# Patient Record
Sex: Female | Born: 1965 | Race: White | Hispanic: No | Marital: Married | Smoking: Never smoker
Health system: Southern US, Community
[De-identification: ages and names within clinical notes are randomized; demographics above are authoritative.]

## PROBLEM LIST (undated history)

## (undated) DIAGNOSIS — I456 Pre-excitation syndrome: Secondary | ICD-10-CM

## (undated) DIAGNOSIS — G43909 Migraine, unspecified, not intractable, without status migrainosus: Secondary | ICD-10-CM

## (undated) DIAGNOSIS — K589 Irritable bowel syndrome without diarrhea: Secondary | ICD-10-CM

## (undated) DIAGNOSIS — I7300 Raynaud's syndrome without gangrene: Secondary | ICD-10-CM

## (undated) DIAGNOSIS — K219 Gastro-esophageal reflux disease without esophagitis: Secondary | ICD-10-CM

## (undated) DIAGNOSIS — T7840XA Allergy, unspecified, initial encounter: Secondary | ICD-10-CM

## (undated) DIAGNOSIS — M797 Fibromyalgia: Secondary | ICD-10-CM

## (undated) DIAGNOSIS — H04129 Dry eye syndrome of unspecified lacrimal gland: Secondary | ICD-10-CM

## (undated) DIAGNOSIS — I10 Essential (primary) hypertension: Secondary | ICD-10-CM

## (undated) DIAGNOSIS — M349 Systemic sclerosis, unspecified: Secondary | ICD-10-CM

## (undated) DIAGNOSIS — K2970 Gastritis, unspecified, without bleeding: Secondary | ICD-10-CM

## (undated) HISTORY — PX: ABDOMINAL HYSTERECTOMY: SHX81

## (undated) HISTORY — DX: Fibromyalgia: M79.7

## (undated) HISTORY — DX: Essential (primary) hypertension: I10

## (undated) HISTORY — DX: Systemic sclerosis, unspecified: M34.9

## (undated) HISTORY — PX: BACK SURGERY: SHX140

## (undated) HISTORY — DX: Allergy, unspecified, initial encounter: T78.40XA

## (undated) HISTORY — DX: Gastro-esophageal reflux disease without esophagitis: K21.9

## (undated) HISTORY — DX: Migraine, unspecified, not intractable, without status migrainosus: G43.909

## (undated) HISTORY — DX: Gastritis, unspecified, without bleeding: K29.70

## (undated) HISTORY — DX: Pre-excitation syndrome: I45.6

## (undated) HISTORY — PX: NECK SURGERY: SHX720

## (undated) HISTORY — PX: PARTIAL HYSTERECTOMY: SHX80

## (undated) HISTORY — DX: Irritable bowel syndrome, unspecified: K58.9

## (undated) HISTORY — DX: Dry eye syndrome of unspecified lacrimal gland: H04.129

## (undated) HISTORY — DX: Raynaud's syndrome without gangrene: I73.00

---

## 2001-10-16 DIAGNOSIS — G43909 Migraine, unspecified, not intractable, without status migrainosus: Secondary | ICD-10-CM | POA: Insufficient documentation

## 2001-10-16 DIAGNOSIS — J309 Allergic rhinitis, unspecified: Secondary | ICD-10-CM | POA: Insufficient documentation

## 2001-10-16 HISTORY — DX: Migraine, unspecified, not intractable, without status migrainosus: G43.909

## 2001-10-16 HISTORY — DX: Allergic rhinitis, unspecified: J30.9

## 2006-10-08 DIAGNOSIS — I7300 Raynaud's syndrome without gangrene: Secondary | ICD-10-CM

## 2006-10-08 DIAGNOSIS — I10 Essential (primary) hypertension: Secondary | ICD-10-CM | POA: Insufficient documentation

## 2006-10-08 HISTORY — DX: Essential (primary) hypertension: I10

## 2006-10-08 HISTORY — DX: Raynaud's syndrome without gangrene: I73.00

## 2009-03-11 DIAGNOSIS — Z136 Encounter for screening for cardiovascular disorders: Secondary | ICD-10-CM

## 2009-03-11 HISTORY — DX: Encounter for screening for cardiovascular disorders: Z13.6

## 2009-10-16 DIAGNOSIS — F320 Major depressive disorder, single episode, mild: Secondary | ICD-10-CM | POA: Insufficient documentation

## 2009-10-16 HISTORY — DX: Major depressive disorder, single episode, mild: F32.0

## 2012-11-29 DIAGNOSIS — M4317 Spondylolisthesis, lumbosacral region: Secondary | ICD-10-CM | POA: Insufficient documentation

## 2012-11-29 HISTORY — DX: Spondylolisthesis, lumbosacral region: M43.17

## 2013-04-28 DIAGNOSIS — G43919 Migraine, unspecified, intractable, without status migrainosus: Secondary | ICD-10-CM | POA: Insufficient documentation

## 2013-04-28 DIAGNOSIS — R519 Headache, unspecified: Secondary | ICD-10-CM

## 2013-04-28 HISTORY — DX: Migraine, unspecified, intractable, without status migrainosus: G43.919

## 2013-04-28 HISTORY — DX: Headache, unspecified: R51.9

## 2013-07-29 DIAGNOSIS — G43009 Migraine without aura, not intractable, without status migrainosus: Secondary | ICD-10-CM

## 2013-07-29 HISTORY — DX: Migraine without aura, not intractable, without status migrainosus: G43.009

## 2014-07-21 DIAGNOSIS — G43919 Migraine, unspecified, intractable, without status migrainosus: Secondary | ICD-10-CM | POA: Insufficient documentation

## 2014-07-21 HISTORY — DX: Migraine, unspecified, intractable, without status migrainosus: G43.919

## 2016-04-28 DIAGNOSIS — M545 Low back pain, unspecified: Secondary | ICD-10-CM

## 2016-04-28 DIAGNOSIS — M5416 Radiculopathy, lumbar region: Secondary | ICD-10-CM | POA: Insufficient documentation

## 2016-04-28 HISTORY — DX: Low back pain, unspecified: M54.50

## 2016-04-28 HISTORY — DX: Radiculopathy, lumbar region: M54.16

## 2017-04-17 DIAGNOSIS — K589 Irritable bowel syndrome without diarrhea: Secondary | ICD-10-CM | POA: Insufficient documentation

## 2017-04-17 DIAGNOSIS — K582 Mixed irritable bowel syndrome: Secondary | ICD-10-CM | POA: Insufficient documentation

## 2017-04-17 DIAGNOSIS — D696 Thrombocytopenia, unspecified: Secondary | ICD-10-CM

## 2017-04-17 HISTORY — DX: Thrombocytopenia, unspecified: D69.6

## 2017-06-05 DIAGNOSIS — M461 Sacroiliitis, not elsewhere classified: Secondary | ICD-10-CM

## 2017-06-05 HISTORY — DX: Sacroiliitis, not elsewhere classified: M46.1

## 2017-07-01 DIAGNOSIS — M7072 Other bursitis of hip, left hip: Secondary | ICD-10-CM | POA: Insufficient documentation

## 2017-07-01 HISTORY — DX: Other bursitis of hip, left hip: M70.72

## 2017-07-17 DIAGNOSIS — M47816 Spondylosis without myelopathy or radiculopathy, lumbar region: Secondary | ICD-10-CM

## 2017-07-17 HISTORY — DX: Spondylosis without myelopathy or radiculopathy, lumbar region: M47.816

## 2017-08-18 DIAGNOSIS — N281 Cyst of kidney, acquired: Secondary | ICD-10-CM | POA: Insufficient documentation

## 2017-08-18 HISTORY — DX: Cyst of kidney, acquired: N28.1

## 2017-10-28 DIAGNOSIS — R768 Other specified abnormal immunological findings in serum: Secondary | ICD-10-CM | POA: Insufficient documentation

## 2017-10-28 HISTORY — DX: Other specified abnormal immunological findings in serum: R76.8

## 2018-04-13 ENCOUNTER — Encounter: Payer: Self-pay | Admitting: Pediatrics

## 2018-04-13 ENCOUNTER — Ambulatory Visit (INDEPENDENT_AMBULATORY_CARE_PROVIDER_SITE_OTHER): Payer: Managed Care, Other (non HMO) | Admitting: Pediatrics

## 2018-04-13 VITALS — BP 116/74 | HR 78 | Temp 97.6°F | Resp 16 | Ht 64.0 in | Wt 135.2 lb

## 2018-04-13 DIAGNOSIS — M349 Systemic sclerosis, unspecified: Secondary | ICD-10-CM | POA: Diagnosis not present

## 2018-04-13 DIAGNOSIS — J45909 Unspecified asthma, uncomplicated: Secondary | ICD-10-CM | POA: Insufficient documentation

## 2018-04-13 DIAGNOSIS — J3089 Other allergic rhinitis: Secondary | ICD-10-CM

## 2018-04-13 DIAGNOSIS — K219 Gastro-esophageal reflux disease without esophagitis: Secondary | ICD-10-CM

## 2018-04-13 DIAGNOSIS — J4530 Mild persistent asthma, uncomplicated: Secondary | ICD-10-CM | POA: Diagnosis not present

## 2018-04-13 DIAGNOSIS — I7300 Raynaud's syndrome without gangrene: Secondary | ICD-10-CM

## 2018-04-13 DIAGNOSIS — M797 Fibromyalgia: Secondary | ICD-10-CM | POA: Diagnosis not present

## 2018-04-13 HISTORY — DX: Gastro-esophageal reflux disease without esophagitis: K21.9

## 2018-04-13 HISTORY — DX: Unspecified asthma, uncomplicated: J45.909

## 2018-04-13 MED ORDER — ALBUTEROL SULFATE HFA 108 (90 BASE) MCG/ACT IN AERS: 2.0000 | INHALATION_SPRAY | RESPIRATORY_TRACT | 1 refills | Status: DC | PRN

## 2018-04-13 MED ORDER — AZELASTINE HCL 0.1 % NA SOLN: NASAL | 5 refills | Status: DC

## 2018-04-13 MED ORDER — MONTELUKAST SODIUM 10 MG PO TABS: 10.0000 mg | ORAL_TABLET | Freq: Every day | ORAL | 5 refills | Status: DC

## 2018-04-13 NOTE — Patient Instructions (Addendum)
Environmental control of dust and mold Claritin 10 mg-take 1 tablet once a day if needed for runny nose Azelastine 0.1% - 2 sprays per nostril twice a day if needed for stuffy nose  or a sinus headache Montelukast 10 mg-take 1 tablet once a day to prevent coughing or wheezing Pro-air 2 puffs every 4 hours if needed for wheezing or coughing spells.  You may use Pro-air 2 puffs 5 to 15 minutes before exercise Add prednisone 10 mg twice a day for 4 days, 10 mg in the fifth day to bring your allergic symptoms under control Call us if you are not doing well on this treatment plan

## 2018-04-13 NOTE — Progress Notes (Signed)
[address] [place] [address] Dept: [phone]  New Patient Note  Patient ID: Jennifer Reed, female    DOB: 1966/02/11  Age: 52 y.o. MRN: [id number] Date of Office Visit: 04/13/2018 Referring provider: No referring provider defined for this encounter.    Chief Complaint: Cough (dx with scleroderma, Raynauds disorder, fibromyalgia and migraines.) and Allergic Rhinitis  (runny nose)  HPI Jennifer Reed presents for evaluation of a runny nose for about a year.  Her symptoms are perennial.  She also has had a cough.  She moved into this area from [place] about a year ago She initially lived in a house that had mold.  They moved into a new house 3 months ago.  She has aggravation of her nasal congestion and a runny nose on exposure to dust, cigarette smoke, perfumes and colognes.  She has had sinus headaches She has a lactose intolerance.  Two years ago following an intestinal infection, she became intolerant of gluten but she had negative celiac antibodies .  She has never had asthmatic symptoms in the past except for a cough during the past year.  She had pneumonia once in the past.  She has gastroesophageal reflux but does does not take medications  She has had Raynaud's syndrome for several years.  Last year she was diagnosed with scleroderma.  She has had fibromyalgia for several years.  She has had thrombocytopenia that comes and goes.  Following lumbar surgery a few years ago she developed a hematoma of her abdomen probably related to the thrombocytopenia and she received for blood transfusions.  Review of Systems  Constitutional: Negative.   HENT:       Runny nose and sinus headaches for about a year  Eyes:       She wears contact lenses  Respiratory:       Cough for about a year .  Pneumonia once in the past  Cardiovascular:       Raynaud's phenomenon for several years  Gastrointestinal:       Heartburn but does not want to take medications No difficulty swallowing    Genitourinary:       Partial hysterectomy  Musculoskeletal:       Fibromyalgia for several years .Scleroderma diagnosed in the past year Occasional pain in her right wrist  Skin:       Dryness of the skin  Neurological:       History of migraine headaches.  History of lumbar laminectomy.  Rhizotomy  Endo/Heme/Allergies:       No diabetes or thyroid disease.  Intermittent thrombocytopenia.  She needed 4  units of blood once because of an abdominal hematoma following surgery  Psychiatric/Behavioral:       Depression    Outpatient Encounter Medications as of 04/13/2018  Medication Sig  . DULoxetine (CYMBALTA) 30 MG capsule Take 30 mg by mouth 3 (three) times daily.  Marland Kitchen estrogens, conjugated, (PREMARIN) 0.3 MG tablet Take 0.3 mg by mouth daily. Take daily for 21 days then do not take for 7 days.  . pregabalin (LYRICA) 150 MG capsule Take 150 mg by mouth 3 (three) times daily.  . pseudoephedrine (SUDAFED) 30 MG tablet Take 30 mg by mouth every 4 (four) hours as needed for congestion.  . sertraline (ZOLOFT) 100 MG tablet Take 100 mg by mouth daily.  Marland Kitchen topiramate (TOPAMAX) 100 MG tablet Take 100 mg by mouth 2 (two) times daily.  Marland Kitchen albuterol (PROAIR HFA) 108 (90 Base) MCG/ACT inhaler Inhale 2 puffs  into the lungs every 4 (four) hours as needed for wheezing or shortness of breath.  Marland Kitchen azelastine (ASTELIN) 0.1 % nasal spray Two sprays each nostril twice a day as needed for nasal congestion or sinus headache.  . fluticasone (FLONASE) 50 MCG/ACT nasal spray Place 1 spray into both nostrils daily.  . montelukast (SINGULAIR) 10 MG tablet Take 1 tablet (10 mg total) by mouth at bedtime.   No facility-administered encounter medications on file as of 04/13/2018.      Drug Allergies:  Allergies  Allergen Reactions  . Codeine Nausea And Vomiting  . Gluten Meal Other (See Comments)  . Lac Bovis Other (See Comments)    GI Upset GI Upset   . Milk-Related Compounds     Family History: [name]'s  Family history is unknown by patient..  Family history is positive for asthma,  eczema and sinus problems in her children.  Family history is negative for angioedema , chronic urticaria, food allergies, lupus, chronic bronchitis or emphysema.  Social and environmental.  There are no pets in the home.  She is not exposed to cigarette smoking.  She has never smoked cigarettes in the past.  She is a homemaker.    Physical Exam: BP 116/74 (BP Location: Right Arm, Patient Position: Sitting, Cuff Size: Normal)   Pulse 78   Temp 97.6 F (36.4 C) (Oral)   Resp 16   Ht 5\' 4"  (1.626 m)   Wt 135 lb 3.2 oz (61.3 kg)   SpO2 99%   BMI 23.21 kg/m    Physical Exam  Constitutional: She is oriented to person, place, and time. She appears well-developed and well-nourished.  HENT:  Eyes normal.  Ears normal.  Nose mild swelling of nasal turbinates.  Pharynx normal.  Neck: Neck supple. No thyromegaly present.  Cardiovascular:  S1-S2 normal no murmurs  Pulmonary/Chest:  Clear to percussion and auscultation  Abdominal: Soft. There is no tenderness.  No hepatosplenomegaly  Lymphadenopathy:    She has no cervical adenopathy.  Neurological: She is alert and oriented to person, place, and time.  Skin:  Clear  Psychiatric: She has a normal mood and affect. Her behavior is normal. Judgment and thought content normal.  Vitals reviewed.   Diagnostics: FVC 4.04 L FEV1 2.90 L.  Predicted FVC 3.56 L predicted FEV1 2.82 L.  After albuterol 2 puffs FVC 4.15 L FEV1 3.02 L- the spirometry is in the normal range and there was no significant improvement after albuterol but she felt that she could breathe deeper  Allergy skin test showed mild reactivity to some molds on intradermal testing only   Assessment  Assessment and Plan: 1. Mild persistent reactive airway disease without complication   2. Other allergic rhinitis   3. Scleroderma (HCC)   4. Fibromyalgia   5. Gastroesophageal reflux disease without  esophagitis   6. Raynaud's disease without gangrene     Meds ordered this encounter  Medications  . azelastine (ASTELIN) 0.1 % nasal spray    Sig: Two sprays each nostril twice a day as needed for nasal congestion or sinus headache.    Dispense:  30 mL    Refill:  5  . montelukast (SINGULAIR) 10 MG tablet    Sig: Take 1 tablet (10 mg total) by mouth at bedtime.    Dispense:  30 tablet    Refill:  5  . albuterol (PROAIR HFA) 108 (90 Base) MCG/ACT inhaler    Sig: Inhale 2 puffs into the lungs every 4 (four) hours  as needed for wheezing or shortness of breath.    Dispense:  1 Inhaler    Refill:  1    Patient Instructions  Environmental control of dust and mold Claritin 10 mg-take 1 tablet once a day if needed for runny nose Azelastine 0.1% - 2 sprays per nostril twice a day if needed for stuffy nose  or a sinus headache Montelukast 10 mg-take 1 tablet once a day to prevent coughing or wheezing Pro-air 2 puffs every 4 hours if needed for wheezing or coughing spells.  You may use Pro-air 2 puffs 5 to 15 minutes before exercise Add prednisone 10 mg twice a day for 4 days, 10 mg in the fifth day to bring your allergic symptoms under control Call us if you are not doing well on this treatment plan   Return in about 6 weeks (around 05/25/2018).   Thank you for the opportunity to care for this patient.  Please do not hesitate to contact me with questions.  Tonette BihariJ. A. Ronav Furney, M.D.  [facility] [address] [place], [place] [id number] [phone]

## 2018-05-13 DIAGNOSIS — G43709 Chronic migraine without aura, not intractable, without status migrainosus: Secondary | ICD-10-CM | POA: Insufficient documentation

## 2018-05-14 DIAGNOSIS — R419 Unspecified symptoms and signs involving cognitive functions and awareness: Secondary | ICD-10-CM

## 2018-05-14 DIAGNOSIS — G4709 Other insomnia: Secondary | ICD-10-CM | POA: Insufficient documentation

## 2018-05-14 DIAGNOSIS — F5101 Primary insomnia: Secondary | ICD-10-CM | POA: Insufficient documentation

## 2018-05-14 HISTORY — DX: Unspecified symptoms and signs involving cognitive functions and awareness: R41.9

## 2018-05-25 ENCOUNTER — Ambulatory Visit: Payer: Managed Care, Other (non HMO) | Admitting: Pediatrics

## 2018-07-16 ENCOUNTER — Ambulatory Visit: Payer: Managed Care, Other (non HMO) | Admitting: Podiatry

## 2018-07-16 DIAGNOSIS — I7300 Raynaud's syndrome without gangrene: Secondary | ICD-10-CM

## 2018-07-16 DIAGNOSIS — L603 Nail dystrophy: Secondary | ICD-10-CM | POA: Diagnosis not present

## 2018-07-19 NOTE — Progress Notes (Signed)
Subjective:   Patient ID: Jennifer Reed, female   DOB: 53 y.o.   MRN: [id number]   HPI 53 year old female presents the office today for concerns of toenail discoloration as well as her nails becoming thickened and they break easily.  Denies any pain in the nails no redness or drainage or any swelling.  She also has Raynauds and she does not want to take oral medication.  She has had this for some time and she does follow-up with rheumatology and she wants to see if there is any other treatment options for her.  She has no other concerns.   Review of Systems  All other systems reviewed and are negative.  Past Medical History:  Diagnosis Date  . Fibromyalgia   . Migraines   . Raynaud's disease   . Scleroderma [facility])     Past Surgical History:  Procedure Laterality Date  . BACK SURGERY    . CESAREAN SECTION  1999 and 1998  . NECK SURGERY    . PARTIAL HYSTERECTOMY       Current Outpatient Medications:  .  AIMOVIG 140 MG/ML SOAJ, ADMINISTER 1 ML UNDER THE SKIN EVERY 30 DAYS, Disp: , Rfl:  .  albuterol (PROAIR HFA) 108 (90 Base) MCG/ACT inhaler, Inhale 2 puffs into the lungs every 4 (four) hours as needed for wheezing or shortness of breath., Disp: 1 Inhaler, Rfl: 1 .  ALPRAZolam (XANAX) 1 MG tablet, , Disp: , Rfl:  .  azelastine (ASTELIN) 0.1 % nasal spray, Two sprays each nostril twice a day as needed for nasal congestion or sinus headache., Disp: 30 mL, Rfl: 5 .  cyclobenzaprine (FLEXERIL) 10 MG tablet, , Disp: , Rfl:  .  DULoxetine (CYMBALTA) 30 MG capsule, Take 30 mg by mouth 3 (three) times daily., Disp: , Rfl:  .  estrogens, conjugated, (PREMARIN) 0.3 MG tablet, Take 0.3 mg by mouth daily. Take daily for 21 days then do not take for 7 days., Disp: , Rfl:  .  fluticasone (FLONASE) 50 MCG/ACT nasal spray, Place 1 spray into both nostrils daily., Disp: , Rfl:  .  metaxalone (SKELAXIN) 800 MG tablet, , Disp: , Rfl:  .  montelukast (SINGULAIR) 10 MG tablet, Take 1 tablet (10 mg  total) by mouth at bedtime., Disp: 30 tablet, Rfl: 5 .  omeprazole (PRILOSEC) 40 MG capsule, , Disp: , Rfl:  .  pregabalin (LYRICA) 150 MG capsule, Take 150 mg by mouth 3 (three) times daily., Disp: , Rfl:  .  pseudoephedrine (SUDAFED) 30 MG tablet, Take 30 mg by mouth every 4 (four) hours as needed for congestion., Disp: , Rfl:  .  rizatriptan (MAXALT) 10 MG tablet, TAKE 1 TABLET AS NEEDED FOR MIGRAINE. MAY REPEAT IN 2 HOURS IF NEEDED, Disp: , Rfl:  .  sertraline (ZOLOFT) 100 MG tablet, Take 100 mg by mouth daily., Disp: , Rfl:  .  Suvorexant (BELSOMRA) 10 MG TABS, Take by mouth., Disp: , Rfl:  .  topiramate (TOPAMAX) 100 MG tablet, Take 100 mg by mouth 2 (two) times daily., Disp: , Rfl:   Allergies  Allergen Reactions  . Codeine Nausea And Vomiting  . Gluten Meal Other (See Comments)  . Lac Bovis Other (See Comments)    GI Upset GI Upset   . Milk-Related Compounds           Objective:  Physical Exam  General: AAO x3, NAD  Dermatological: Nails are dystrophic as well as brittle.  There is slight discoloration of the nails.  There is no pain in the nails there is no strength redness drainage or any signs of infection.  No open lesions.  Vascular: Dorsalis Pedis artery and Posterior Tibial artery pedal pulses are 2/4 bilateral with a delayed capillary fill time. There is discoloration of the toes consistent with Raynauds there is no pain with calf compression, swelling, warmth, erythema.   Neruologic: Grossly intact via light touch bilateral. Protective threshold with Semmes Wienstein monofilament intact to all pedal sites bilateral.   Musculoskeletal: No gross boney pedal deformities bilateral. No pain, crepitus, or limitation noted with foot and ankle range of motion bilateral. Muscular strength 5/5 in all groups tested bilateral.  Gait: Unassisted, Nonantalgic.      Assessment:   Onychodystrophy, Raynauds    Plan:  -Treatment options discussed including all alternatives,  risks, and complications -Etiology of symptoms were discussed -Discussed treatment options with nails.  I did debride the sinus symptoms for culture, pathology to [facility] labs.  Await the results before proceed with definitive treatment. -In regards to the Raynauds I ordered a compound cream to include verapamil.  I sent this to [facility].  Discussed other measures of her she has been treated for some time.  Vivi Barrack DPM

## 2018-07-20 NOTE — Addendum Note (Signed)
Addended by: Hadley Pen R on: 07/20/2018 08:40 AM   Modules accepted: Orders

## 2018-08-03 ENCOUNTER — Telehealth: Payer: Self-pay | Admitting: Podiatry

## 2018-08-03 NOTE — Telephone Encounter (Signed)
Calling to check on status of toenail biopsy.

## 2018-08-05 ENCOUNTER — Telehealth: Payer: Self-pay | Admitting: Podiatry

## 2018-08-05 DIAGNOSIS — L603 Nail dystrophy: Secondary | ICD-10-CM

## 2018-08-05 DIAGNOSIS — Z79899 Other long term (current) drug therapy: Secondary | ICD-10-CM

## 2018-08-05 NOTE — Telephone Encounter (Signed)
Patient called back to inform the nurse that she would need to have an order for labs sent to [facility]. She was unable to get her recent labs from her PCP because their office is closed until 08/26/18. Please give pt a call once the order has been sent.

## 2018-08-05 NOTE — Telephone Encounter (Signed)
It does likely a yeast in the toenail. I would try itraconazole. If she doesn't want to do it as an oral medication we can do the topical through [facility]. Laser for this type of fungus is not as effective. Thanks.

## 2018-08-05 NOTE — Addendum Note (Signed)
Addended by: Alphia Kava D on: 08/05/2018 02:42 PM   Modules accepted: Orders

## 2018-08-05 NOTE — Telephone Encounter (Signed)
I informed pt the labs had been sent to [facility].

## 2018-08-05 NOTE — Telephone Encounter (Signed)
I informed pt of Dr. Gabriel Rung review of results and orders. Pt states she would like to use the itraconazole, and had blood work at her GI doctor's office about a week ago. Pt also states her platelets are chronically low, but normal for her, and she would like Dr. Ardelle Anton to know that.

## 2018-08-09 LAB — CBC WITH DIFFERENTIAL/PLATELET
Absolute Monocytes: 291 cells/uL (ref 200–950)
Basophils Absolute: 21 cells/uL (ref 0–200)
Basophils Relative: 0.5 %
Eosinophils Absolute: 250 cells/uL (ref 15–500)
Eosinophils Relative: 6.1 %
HCT: 43.9 % (ref 35.0–45.0)
Hemoglobin: 14.8 g/dL (ref 11.7–15.5)
Lymphs Abs: 1017 cells/uL (ref 850–3900)
MCH: 32 pg (ref 27.0–33.0)
MCHC: 33.7 g/dL (ref 32.0–36.0)
MCV: 94.8 fL (ref 80.0–100.0)
MPV: 12 fL (ref 7.5–12.5)
Monocytes Relative: 7.1 %
Neutro Abs: 2522 cells/uL (ref 1500–7800)
Neutrophils Relative %: 61.5 %
Platelets: 146 10*3/uL (ref 140–400)
RBC: 4.63 10*6/uL (ref 3.80–5.10)
RDW: 12.6 % (ref 11.0–15.0)
Total Lymphocyte: 24.8 %
WBC: 4.1 10*3/uL (ref 3.8–10.8)

## 2018-08-09 LAB — HEPATIC FUNCTION PANEL
AG Ratio: 1.9 (calc) (ref 1.0–2.5)
ALT: 13 U/L (ref 6–29)
AST: 19 U/L (ref 10–35)
Albumin: 4.1 g/dL (ref 3.6–5.1)
Alkaline phosphatase (APISO): 71 U/L (ref 37–153)
Bilirubin, Direct: 0.1 mg/dL (ref 0.0–0.2)
Globulin: 2.2 g/dL (calc) (ref 1.9–3.7)
Indirect Bilirubin: 0.2 mg/dL (calc) (ref 0.2–1.2)
Total Bilirubin: 0.3 mg/dL (ref 0.2–1.2)
Total Protein: 6.3 g/dL (ref 6.1–8.1)

## 2018-08-11 ENCOUNTER — Other Ambulatory Visit: Payer: Self-pay | Admitting: Podiatry

## 2018-08-11 ENCOUNTER — Telehealth: Payer: Self-pay | Admitting: *Deleted

## 2018-08-11 MED ORDER — FLUCONAZOLE 150 MG PO TABS: ORAL_TABLET | ORAL | 0 refills | Status: DC

## 2018-08-11 NOTE — Telephone Encounter (Signed)
I informed pt of Dr. Wagoner's review of results and orders. Pt states understanding. 

## 2018-08-11 NOTE — Telephone Encounter (Addendum)
-----   Message from Vivi Barrack, DPM sent at 08/11/2018  9:51 AM EDT ----- Actually I see her labs. Lets do fluconazole 150mg  once a week for 12 weeks. I want to see her back in 6 weeks. Thanks.

## 2018-08-11 NOTE — Telephone Encounter (Signed)
Has she sent the labs? I don't see them.

## 2018-08-12 ENCOUNTER — Telehealth: Payer: Self-pay | Admitting: *Deleted

## 2018-08-12 NOTE — Telephone Encounter (Signed)
Pt called states it is not an emergency. I called pt an informed I had changed to #12 Diflucan.

## 2018-10-07 ENCOUNTER — Ambulatory Visit: Payer: Managed Care, Other (non HMO) | Admitting: Podiatry

## 2018-10-08 ENCOUNTER — Other Ambulatory Visit: Payer: Self-pay | Admitting: Pediatrics

## 2018-10-12 ENCOUNTER — Ambulatory Visit: Payer: Managed Care, Other (non HMO) | Admitting: Podiatry

## 2018-10-13 DIAGNOSIS — K649 Unspecified hemorrhoids: Secondary | ICD-10-CM

## 2018-10-13 DIAGNOSIS — Z860100 Personal history of colon polyps, unspecified: Secondary | ICD-10-CM

## 2018-10-13 HISTORY — DX: Personal history of colonic polyps: Z86.010

## 2018-10-13 HISTORY — DX: Unspecified hemorrhoids: K64.9

## 2018-10-13 HISTORY — DX: Personal history of colon polyps, unspecified: Z86.0100

## 2018-10-21 ENCOUNTER — Encounter: Payer: Self-pay | Admitting: Podiatry

## 2018-10-21 ENCOUNTER — Ambulatory Visit (INDEPENDENT_AMBULATORY_CARE_PROVIDER_SITE_OTHER): Payer: Managed Care, Other (non HMO) | Admitting: Podiatry

## 2018-10-21 ENCOUNTER — Other Ambulatory Visit: Payer: Self-pay

## 2018-10-21 VITALS — Temp 98.0°F

## 2018-10-21 DIAGNOSIS — B351 Tinea unguium: Secondary | ICD-10-CM

## 2018-10-27 NOTE — Progress Notes (Signed)
Subjective: 53 year old female presents the office today for evaluation of nail fungus.  She has been on Diflucan and she states that the toenails are looking significantly better.  She has no pain to the nails and denies any redness or drainage or any swelling she has no other concerns. Denies any systemic complaints such as fevers, chills, nausea, vomiting. No acute changes since last appointment, and no other complaints at this time.   Objective: AAO x3, NAD DP/PT pulses palpable bilaterally, CRT less than 3 seconds Overall toenails are much improved.  Still some discoloration more to the tip of the toenails but overall appear to be much clearer.  There is no pain in the there is no edema, erythema, drainage or possibly signs of infection. No open lesions or pre-ulcerative lesions.  No pain with calf compression, swelling, warmth, erythema  Assessment: Onychomycosis  Plan: -All treatment options discussed with the patient including all alternatives, risks, complications.  -We discussed continuing treatment.  For the time I ordered a compound cream through [facility] to include fluconazole.  Discussed use, side effects. -Patient encouraged to call the office with any questions, concerns, change in symptoms.   Trula Slade DPM

## 2018-11-01 ENCOUNTER — Other Ambulatory Visit: Payer: Self-pay | Admitting: Pediatrics

## 2018-11-11 ENCOUNTER — Ambulatory Visit (INDEPENDENT_AMBULATORY_CARE_PROVIDER_SITE_OTHER): Payer: Managed Care, Other (non HMO) | Admitting: Podiatry

## 2018-11-11 ENCOUNTER — Other Ambulatory Visit: Payer: Self-pay

## 2018-11-11 ENCOUNTER — Encounter: Payer: Self-pay | Admitting: Podiatry

## 2018-11-11 VITALS — Temp 97.3°F

## 2018-11-11 DIAGNOSIS — L600 Ingrowing nail: Secondary | ICD-10-CM | POA: Diagnosis not present

## 2018-11-11 NOTE — Patient Instructions (Signed)

## 2018-11-18 ENCOUNTER — Other Ambulatory Visit: Payer: Self-pay

## 2018-11-18 ENCOUNTER — Ambulatory Visit (INDEPENDENT_AMBULATORY_CARE_PROVIDER_SITE_OTHER): Payer: Self-pay

## 2018-11-18 DIAGNOSIS — L600 Ingrowing nail: Secondary | ICD-10-CM

## 2018-11-18 NOTE — Patient Instructions (Signed)

## 2018-11-22 NOTE — Progress Notes (Signed)
Subjective: 53 year old female presents the office today for concerns of ingrown toenails of the right big toe, lateral aspect.  Areas become tender.  Denies any drainage or pus.  There is painful with pressure.  Localized redness but no red streaks. Denies any systemic complaints such as fevers, chills, nausea, vomiting. No acute changes since last appointment, and no other complaints at this time.   Objective: AAO x3, NAD DP/PT pulses palpable bilaterally, CRT less than 3 seconds Incurvation present to the lateral aspect the right hallux toenail with localized edema and faint erythema but there is no ascending cellulitis.  There is no fluctuation crepitation any malodor.  No drainage or pus. No open lesions or pre-ulcerative lesions.  No pain with calf compression, swelling, warmth, erythema  Assessment: Right lateral hallux ingrown toenail  Plan: -All treatment options discussed with the patient including all alternatives, risks, complications.  -At this time, recommended partial nail removal without chemical matricectomy to the lateral right hallux nail border. Risks and complications were discussed with the patient for which they understand and  verbally consent to the procedure. Under sterile conditions a total of 3 mL of a mixture of 2% lidocaine plain and 0.5% Marcaine plain was infiltrated in a hallux block fashion. Once anesthetized, the skin was prepped in sterile fashion. Next the lateral border of the hallux nail border was sharply excised making sure to remove the entire offending nail border. Once the nail was  Removed, the area was debrided and the underlying skin was intact. The area was irrigated and hemostasis was obtained.  A dry sterile dressing was applied. After application of the dressing t there is found to be an immediate capillary refill time to the digit. The patient tolerated the procedure well any complications. Post procedure instructions were discussed the patient for  which he verbally understood. Follow-up in one week for nail check or sooner if any problems are to arise. Discussed signs/symptoms of worsening infection and directed to call the office immediately should any occur or go directly to the emergency room. In the meantime, encouraged to call the office with any questions, concerns, changes symptoms.  Trula Slade DPM

## 2018-12-23 NOTE — Progress Notes (Signed)
Patient was seen today for follow-up appointment, recent procedure performed on 11/11/2018, removal of ingrown toenails right big toe lateral aspect.  She states that she feels like the areas are healing well, and she is having problems at this time.  No redness, no swelling, no drainage, no erythema, no other signs and symptoms of infection.  Area scabbed over well and healing at this time.  Discussed signs and symptoms of infection with the patient, verbal and written instructions were given.  She is to follow-up as needed with any acute symptom changes.

## 2019-05-25 DIAGNOSIS — M359 Systemic involvement of connective tissue, unspecified: Secondary | ICD-10-CM

## 2019-05-25 HISTORY — DX: Systemic involvement of connective tissue, unspecified: M35.9

## 2019-08-25 ENCOUNTER — Encounter: Payer: Self-pay | Admitting: Podiatry

## 2019-08-25 ENCOUNTER — Ambulatory Visit: Payer: Managed Care, Other (non HMO) | Admitting: Podiatry

## 2019-08-25 ENCOUNTER — Other Ambulatory Visit: Payer: Self-pay

## 2019-08-25 DIAGNOSIS — B351 Tinea unguium: Secondary | ICD-10-CM

## 2019-08-25 DIAGNOSIS — L600 Ingrowing nail: Secondary | ICD-10-CM | POA: Diagnosis not present

## 2019-08-25 MED ORDER — EFINACONAZOLE 10 % EX SOLN: 1.0000 [drp] | Freq: Every day | CUTANEOUS | 11 refills | Status: DC

## 2019-08-25 NOTE — Patient Instructions (Signed)

## 2019-08-29 NOTE — Progress Notes (Signed)
Subjective: 54 year old female presents the office today to have her big toenails evaluated.  She is having some mild discomfort with shoes and pressure to the nail corners.  She denies the nails are obtaining somewhat ingrown.  Denies any drainage or pus.  She soaks it intermittently. Denies any systemic complaints such as fevers, chills, nausea, vomiting. No acute changes since last appointment, and no other complaints at this time.   Objective: AAO x3, NAD DP/PT pulses palpable bilaterally, CRT less than 3 seconds Bilateral hallux nails are mildly dystrophic, discolored with yellow-brown discoloration.  Mild incurvation present.  There is no edema, erythema any signs of infection.  No open lesions or pre-ulcerative lesions.  No pain with calf compression, swelling, warmth, erythema  Assessment: Onychomycosis, ingrown toenails  Plan: -All treatment options discussed with the patient including all alternatives, risks, complications.  -I discussed the partial nail avulsion however she was to hold off on this currently and she is taking care of her husband after surgery.  Epson salt soaks.  Prescribed Jublia for nail fungus. -Patient encouraged to call the office with any questions, concerns, change in symptoms.   Vivi Barrack DPM

## 2019-11-11 ENCOUNTER — Ambulatory Visit: Payer: Managed Care, Other (non HMO) | Admitting: Podiatry

## 2019-12-09 ENCOUNTER — Ambulatory Visit: Payer: Managed Care, Other (non HMO) | Admitting: Podiatry

## 2019-12-23 ENCOUNTER — Ambulatory Visit: Payer: Managed Care, Other (non HMO) | Admitting: Podiatry

## 2020-01-13 ENCOUNTER — Encounter: Payer: Self-pay | Admitting: Podiatry

## 2020-01-13 ENCOUNTER — Other Ambulatory Visit: Payer: Self-pay

## 2020-01-13 ENCOUNTER — Ambulatory Visit (INDEPENDENT_AMBULATORY_CARE_PROVIDER_SITE_OTHER): Payer: Managed Care, Other (non HMO) | Admitting: Podiatry

## 2020-01-13 VITALS — Temp 97.7°F

## 2020-01-13 DIAGNOSIS — L600 Ingrowing nail: Secondary | ICD-10-CM

## 2020-01-20 DIAGNOSIS — L600 Ingrowing nail: Secondary | ICD-10-CM

## 2020-01-20 HISTORY — DX: Ingrowing nail: L60.0

## 2020-01-20 NOTE — Progress Notes (Signed)
Subjective: 54 year old female presents the office today for evaluation of possible ingrown toenails to her big toenails on the medial aspect.  She states that left leg is worse than right but currently she has no pain she is not sure if it is actually ingrown but she wants to have the area checked.  She still cannot have the procedure performed if able.  No recent injury or changes otherwise. Denies any systemic complaints such as fevers, chills, nausea, vomiting. No acute changes since last appointment, and no other complaints at this time.   Objective: AAO x3, NAD DP/PT pulses palpable bilaterally, CRT less than 3 seconds Minimal incurvation present to the medial aspects of bilateral hallux times left side worse than right on the distal aspect.  There is no edema, erythema or any signs of infection is no pain today. No pain with calf compression, swelling, warmth, erythema  Assessment: Ingrown toenails, asymptomatic currently  Plan: -All treatment options discussed with the patient including all alternatives, risks, complications.  -Discussed with conservative as well as surgical options.  This time she having no pain or infection.  Discussed nail trimming techniques and avoid pressure to the toenails.  If symptoms come back we can always perform a partial nail avulsion with chemical matricectomy if needed but she wants to hold off on this today. -Patient encouraged to call the office with any questions, concerns, change in symptoms.   Vivi Barrack DPM

## 2020-02-16 DIAGNOSIS — I7300 Raynaud's syndrome without gangrene: Secondary | ICD-10-CM | POA: Insufficient documentation

## 2020-02-17 ENCOUNTER — Other Ambulatory Visit: Payer: Self-pay

## 2020-02-17 ENCOUNTER — Ambulatory Visit (INDEPENDENT_AMBULATORY_CARE_PROVIDER_SITE_OTHER): Payer: Managed Care, Other (non HMO) | Admitting: Cardiology

## 2020-02-17 ENCOUNTER — Encounter: Payer: Self-pay | Admitting: Cardiology

## 2020-02-17 VITALS — BP 136/94 | HR 83 | Ht 64.0 in | Wt 118.1 lb

## 2020-02-17 DIAGNOSIS — I7300 Raynaud's syndrome without gangrene: Secondary | ICD-10-CM

## 2020-02-17 DIAGNOSIS — I10 Essential (primary) hypertension: Secondary | ICD-10-CM

## 2020-02-17 DIAGNOSIS — R0600 Dyspnea, unspecified: Secondary | ICD-10-CM | POA: Diagnosis not present

## 2020-02-17 DIAGNOSIS — M349 Systemic sclerosis, unspecified: Secondary | ICD-10-CM

## 2020-02-17 DIAGNOSIS — R0609 Other forms of dyspnea: Secondary | ICD-10-CM

## 2020-02-17 NOTE — Patient Instructions (Signed)
Medication Instructions:  Your physician recommends that you continue on your current medications as directed. Please refer to the Current Medication list given to you today.  *If you need a refill on your cardiac medications before your next appointment, please call your pharmacy*   Lab Work: None   If you have labs (blood work) drawn today and your tests are completely normal, you will receive your results only by: . MyChart Message (if you have MyChart) OR . A paper copy in the mail If you have any lab test that is abnormal or we need to change your treatment, we will call you to review the results.   Testing/Procedures:  Your physician has requested that you have an echocardiogram. Echocardiography is a painless test that uses sound waves to create images of your heart. It provides your doctor with information about the size and shape of your heart and how well your heart's chambers and valves are working. This procedure takes approximately one hour. There are no restrictions for this procedure.      Follow-Up: At [facility], you and your health needs are our priority.  As part of our continuing mission to provide you with exceptional heart care, we have created designated Provider Care Teams.  These Care Teams include your primary Cardiologist (physician) and Advanced Practice Providers (APPs -  Physician Assistants and Nurse Practitioners) who all work together to provide you with the care you need, when you need it.  We recommend signing up for the patient portal called "MyChart".  Sign up information is provided on this After Visit Summary.  MyChart is used to connect with patients for Virtual Visits (Telemedicine).  Patients are able to view lab/test results, encounter notes, upcoming appointments, etc.  Non-urgent messages can be sent to your provider as well.   To learn more about what you can do with MyChart, go to [email or web address].    Your next appointment:   2  month(s)  The format for your next appointment:   In Person  Provider:   Robert Krasowski, MD   Other Instructions   Echocardiogram An echocardiogram is a procedure that uses painless sound waves (ultrasound) to produce an image of the heart. Images from an echocardiogram can provide important information about:  Signs of coronary artery disease (CAD).  Aneurysm detection. An aneurysm is a weak or damaged part of an artery wall that bulges out from the normal force of blood pumping through the body.  Heart size and shape. Changes in the size or shape of the heart can be associated with certain conditions, including heart failure, aneurysm, and CAD.  Heart muscle function.  Heart valve function.  Signs of a past heart attack.  Fluid buildup around the heart.  Thickening of the heart muscle.  A tumor or infectious growth around the heart valves. Tell a health care provider about:  Any allergies you have.  All medicines you are taking, including vitamins, herbs, eye drops, creams, and over-the-counter medicines.  Any blood disorders you have.  Any surgeries you have had.  Any medical conditions you have.  Whether you are pregnant or may be pregnant. What are the risks? Generally, this is a safe procedure. However, problems may occur, including:  Allergic reaction to dye (contrast) that may be used during the procedure. What happens before the procedure? No specific preparation is needed. You may eat and drink normally. What happens during the procedure?   An IV tube may be inserted into one of your veins.    You may receive contrast through this tube. A contrast is an injection that improves the quality of the pictures from your heart.  A gel will be applied to your chest.  A wand-like tool (transducer) will be moved over your chest. The gel will help to transmit the sound waves from the transducer.  The sound waves will harmlessly bounce off of your heart to  allow the heart images to be captured in real-time motion. The images will be recorded on a computer. The procedure may vary among health care providers and hospitals. What happens after the procedure?  You may return to your normal, everyday life, including diet, activities, and medicines, unless your health care provider tells you not to do that. Summary  An echocardiogram is a procedure that uses painless sound waves (ultrasound) to produce an image of the heart.  Images from an echocardiogram can provide important information about the size and shape of your heart, heart muscle function, heart valve function, and fluid buildup around your heart.  You do not need to do anything to prepare before this procedure. You may eat and drink normally.  After the echocardiogram is completed, you may return to your normal, everyday life, unless your health care provider tells you not to do that. This information is not intended to replace advice given to you by your health care provider. Make sure you discuss any questions you have with your health care provider. Document Revised: 08/19/2018 Document Reviewed: 05/31/2016 Elsevier Patient Education  2020 [employer]   

## 2020-02-17 NOTE — Progress Notes (Signed)
Cardiology Consultation:    Date:  02/17/2020   ID:  Jennifer Reed, DOB 1965/06/08, MRN [id number]  PCP:  Adrienne Mocha, PA  Cardiologist:  Gypsy Balsam, MD   Referring MD: Wilfrid Lund, PA   No chief complaint on file. I have a high blood pressure  History of Present Illness:    Jennifer Reed is a 54 y.o. female who is being seen today for the evaluation of hypertension at the request of Wilfrid Lund, [place]. She is a young lady with multiple medical issues. Apparently she does have some connective tissue disorder and suspicion for scleroderma as there apparently she does have some markers but no signs and symptoms of it according to the patient. She was referred to [place] because she does have high blood pressure and is some issue about her management of this problem. She reported to have COVID-19 infection months ago she did have difficult time recovering from it but gradually getting back to herself. Denies have any chest pain tightness squeezing pressure burning chest. She described to have some shortness of breath and fatigue while walking. Denies having any chest pain tightness pressure burning in the chest. She is very concerned about her high blood pressure. She also concern about the fact that her brother got WPW also her mother got coronary artery disease at the early age.  Past Medical History:  Diagnosis Date  . Fibromyalgia   . Migraines   . Raynaud's disease   . Scleroderma [facility])     Past Surgical History:  Procedure Laterality Date  . BACK SURGERY    . CESAREAN SECTION  1999 and 1998  . NECK SURGERY    . PARTIAL HYSTERECTOMY      Current Medications: Current Meds  Medication Sig  . ALPRAZolam (XANAX) 1 MG tablet Take 1 mg by mouth 3 (three) times daily as needed.   . doxepin (SINEQUAN) 10 MG capsule Take by mouth.  . Efinaconazole 10 % SOLN Apply 1 drop topically daily.  Marland Kitchen estrogens, conjugated, (PREMARIN) 0.625 MG tablet TAKE 1 TABLET BY MOUTH DAILY FOR 21 DAYS  THEN DO NOT TAKE FOR 7 DAYS  . metroNIDAZOLE (METROCREAM) 0.75 % cream Apply topically 2 (two) times daily.  . pregabalin (LYRICA) 150 MG capsule Take 150 mg by mouth 3 (three) times daily.  . rizatriptan (MAXALT) 10 MG tablet TAKE 1 TABLET AS NEEDED FOR MIGRAINE. MAY REPEAT IN 2 HOURS IF NEEDED  . SAVELLA 50 MG TABS tablet Take 50 mg by mouth 2 (two) times daily.  . sertraline (ZOLOFT) 100 MG tablet Take 100 mg by mouth daily.  Marland Kitchen topiramate (TOPAMAX) 100 MG tablet Take 100 mg by mouth 2 (two) times daily.     Allergies:   Codeine, Gluten meal, Lac bovis, and Milk-related compounds   Social History   Socioeconomic History  . Marital status: Married    Spouse name: Not on file  . Number of children: Not on file  . Years of education: Not on file  . Highest education level: Not on file  Occupational History  . Not on file  Tobacco Use  . Smoking status: Never Smoker  . Smokeless tobacco: Never Used  Vaping Use  . Vaping Use: Never used  Substance and Sexual Activity  . Alcohol use: Yes    Comment: social drinks rarely  . Drug use: Never  . Sexual activity: Not on file  Other Topics Concern  . Not on file  Social History Narrative  .  Not on file   Social Determinants of Health   Financial Resource Strain:   . Difficulty of Paying Living Expenses: Not on file  Food Insecurity:   . Worried About Programme researcher, broadcasting/film/video in the Last Year: Not on file  . Ran Out of Food in the Last Year: Not on file  Transportation Needs:   . Lack of Transportation (Medical): Not on file  . Lack of Transportation (Non-Medical): Not on file  Physical Activity:   . Days of Exercise per Week: Not on file  . Minutes of Exercise per Session: Not on file  Stress:   . Feeling of Stress : Not on file  Social Connections:   . Frequency of Communication with Friends and Family: Not on file  . Frequency of Social Gatherings with Friends and Family: Not on file  . Attends Religious Services: Not on file   . Active Member of Clubs or Organizations: Not on file  . Attends Banker Meetings: Not on file  . Marital Status: Not on file     Family History: The patient's Family history is unknown by patient. ROS:   Please see the history of present illness.    All 14 point review of systems negative except as described per history of present illness.  EKGs/Labs/Other Studies Reviewed:    The following studies were reviewed today:   EKG:  EKG is  ordered today.  The ekg ordered today demonstrates EKG today showed normal sinus rhythm normal P interval normal QS complex duration morphology no ST segment changes  Recent Labs: No results found for requested labs within last 8760 hours.  Recent Lipid Panel No results found for: CHOL, TRIG, HDL, CHOLHDL, VLDL, LDLCALC, LDLDIRECT  Physical Exam:    VS:  BP (!) 136/94   Pulse 83   Ht 5\' 4"  (1.626 m)   Wt 118 lb 1.9 oz (53.6 kg)   SpO2 97%   BMI 20.28 kg/m     Wt Readings from Last 3 Encounters:  02/17/20 118 lb 1.9 oz (53.6 kg)  04/13/18 135 lb 3.2 oz (61.3 kg)     GEN:  Well nourished, well developed in no acute distress HEENT: Normal NECK: No JVD; No carotid bruits LYMPHATICS: No lymphadenopathy CARDIAC: RRR, no murmurs, no rubs, no gallops RESPIRATORY:  Clear to auscultation without rales, wheezing or rhonchi  ABDOMEN: Soft, non-tender, non-distended MUSCULOSKELETAL:  No edema; No deformity  SKIN: Warm and dry NEUROLOGIC:  Alert and oriented x 3 PSYCHIATRIC:  Normal affect   ASSESSMENT:    1. Essential hypertension   2. Dyspnea on exertion   3. Primary hypertension   4. Raynaud's disease without gangrene   5. Scleroderma (HCC)    PLAN:    In order of problems listed above:  1. Essential hypertension. She brought blood pressure measurements for me. Those measurements concerning. Her blood pressure typically is between one 30-1 forty systolic with diastolic in the neighborhood of 80-90. She is very  concerned about it she was given some Norvasc however had intolerance to it and does not want to take this medication anymore. I look at the EKG which showed no evidence of LVH. I will schedule her to have echocardiogram to assess left ventricle ejection fraction more importantly look at the left ventricle hypertrophy. 2. Dyspnea on exertion multifactorial we will get echocardiogram to assess left ventricle ejection fraction. 3. History of scleroderma. Of course concerns about potentially pulmonary hypertension. Again echocardiogram will be done to assess  that. 4. Raynaud's phenomenon. Most likely related to her connective tissue disorder.   Medication Adjustments/Labs and Tests Ordered: Current medicines are reviewed at length with the patient today.  Concerns regarding medicines are outlined above.  Orders Placed This Encounter  Procedures  . EKG 12-Lead  . ECHOCARDIOGRAM COMPLETE   No orders of the defined types were placed in this encounter.   Signed, Georgeanna Lea, MD, [facility]. 02/17/2020 1:18 PM    [facility]

## 2020-03-05 ENCOUNTER — Other Ambulatory Visit: Payer: Self-pay

## 2020-03-05 ENCOUNTER — Ambulatory Visit (HOSPITAL_BASED_OUTPATIENT_CLINIC_OR_DEPARTMENT_OTHER)
Admission: RE | Admit: 2020-03-05 | Discharge: 2020-03-05 | Disposition: A | Discharge status: Home / Self Care | Payer: Managed Care, Other (non HMO) | Source: Ambulatory Visit | Attending: Cardiology | Admitting: Cardiology

## 2020-03-05 DIAGNOSIS — I10 Essential (primary) hypertension: Secondary | ICD-10-CM | POA: Insufficient documentation

## 2020-03-05 DIAGNOSIS — R0609 Other forms of dyspnea: Secondary | ICD-10-CM

## 2020-03-05 DIAGNOSIS — R0600 Dyspnea, unspecified: Secondary | ICD-10-CM | POA: Insufficient documentation

## 2020-03-06 ENCOUNTER — Telehealth: Payer: Self-pay | Admitting: Emergency Medicine

## 2020-03-06 LAB — ECHOCARDIOGRAM COMPLETE
Area-P 1/2: 5.23 cm2
P 1/2 time: 497 msec
S' Lateral: 2.8 cm

## 2020-03-06 NOTE — Telephone Encounter (Signed)
-----   Message from Georgeanna Lea, MD sent at 03/06/2020  3:28 PM EDT ----- Echocardiogram showed normal left ventricle ejection fraction, overall looks good

## 2020-03-06 NOTE — Telephone Encounter (Signed)
Called patient informed her of results. She wants more explanation from Dr. Bing Matter. The echo tech told her she had a murmur and is worried about that will forward message to Dr. Bing Matter.

## 2020-03-06 NOTE — Progress Notes (Addendum)
[facility]    Provider:  Dr Lucia Gaskins Requesting Provider: Adrienne Mocha, PA Primary Care Provider:  Adrienne Mocha, PA  CC:  migraines  HPI:  Jennifer Reed is a 54 y.o. female here as requested by Adrienne Mocha, PA for migraines.  Past medical history migraine without aura, major depressive disorder recurrent moderate, scleroderma, left renal mass, rosacea, fibromyalgia, anxiety, Raynaud's phenomena, primary insomnia.  I reviewed Jennifer Reed's notes: It appears patient is already on Aimovig, sending to [place] here for evaluation of Botox injections for migraines, general examination was normal including nose ears eyes throat, lungs, heart, musculoskeletal, psychiatric and neurologic, no details on patient's migraine disorder provided which would be helpful when referring to a specialist.  I was able to review epic notes, patient was last seen at [facility] for Botox injections just a few days ago, unclear why she is being sent here for Botox if she is already seen at a headache clinic in the area.  It appears she is already tried Flexeril, tizanidine, Topamax, Trokendi, gabapentin, Lyrica, amitriptyline, Celexa, Lexapro, fluoxetine, Paxil, Zoloft, Cymbalta, Savella, doxepin, Aimovig, Botox, dry needling, yoga, acupuncture, trigger point injections and Botox.  She has also been seen in neurosurgery earlier this month for chronic neck pain.  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 physical therapy for her chronic neck pain as well which has been ongoing for at least several years as well and was managed in the past by Pain Medicine at [facility] for her chronic neck pain.  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  Echo notes  going back to at least 2008 for her migraines.  Patient is here alone, as above she is already a patient at a headache clinic and received botox and is on CGRP. She does PT for her neck at [facility]. They are working on her posture, dry needling. She has such huge knots. She has been to pain management and continues to go, she has had steroid injections. She was in [place] and had a neurologist for 10 years, she has been here 3 years, she has seen [facility] neurology. She does not know what she wants to focus on today, she wakes up with migraines, she has difficulty falling asleep and she can't stay asleep, she wakes up with headaches 4-5 times a week. She loved Aimovig* but insurance wouldn't pay for both. She felt being on both helped. She has TMJ. She treats her headaches with tylenol, tried [place]. She has at least one migraine that puts her in bed for 3-4 days severe but she also has moderately severe migraines al most every day. She has a headache every day. No aura. NO medication overuse. She does not know what she wants to focus on today, she wakes up with migraines, she has difficulty falling asleep and she can't stay asleep, she wakes up with headaches 4-5 times a week. She has new diplopia.   .  It appears she is already tried Flexeril, tizanidine, Topamax, Trokendi, gabapentin, Lyrica, amitriptyline, Celexa, Lexapro, fluoxetine, Paxil, Zoloft, Cymbalta, Savella, doxepin, Aimovig, Botox, dry needling, yoga, acupuncture, trigger point injections and Botox, Maxalt, Fioricet, propranolol, amlodipine   Reviewed notes, labs and imaging from outside physicians,  which showed: see above, spent 70 minutes reviewing chart  Review of Systems: Patient complains of symptoms per HPI as well as the following symptoms: intractable migraines and headaches. Pertinent negatives and positives per HPI. All others negative.   Social History   Socioeconomic History   Marital status: Married    Spouse name: Not on  file   Number of children: 2   Years of education: Not on file   Highest education level: Not on file  Occupational History   Not on file  Tobacco Use   Smoking status: Never Smoker   Smokeless tobacco: Never Used  Vaping Use   Vaping Use: Never used  Substance and Sexual Activity   Alcohol use: Yes    Comment: social drinks rarely ("like a cocktail a month")   Drug use: Never   Sexual activity: Not on file  Other Topics Concern   Not on file  Social History Narrative   Lives at home with husband and dog   Right handed   Caffeine: n/a    Social Determinants of Health   Financial Resource Strain:    Difficulty of Paying Living Expenses: Not on file  Food Insecurity:    Worried About Programme researcher, broadcasting/film/video in the Last Year: Not on file   [employer] of Food in the Last Year: Not on file  Transportation Needs:    Lack of Transportation (Medical): Not on file   Lack of Transportation (Non-Medical): Not on file  Physical Activity:    Days of Exercise per Week: Not on file   Minutes of Exercise per Session: Not on file  Stress:    Feeling of Stress : Not on file  Social Connections:    Frequency of Communication with Friends and Family: Not on file   Frequency of Social Gatherings with Friends and Family: Not on file   Attends Religious Services: Not on file   Active Member of Clubs or Organizations: Not on file   Attends Banker Meetings: Not on file   Marital Status: Not on file  Intimate Partner Violence:    Fear of Current or Ex-Partner: Not on file   Emotionally Abused: Not on file   Physically Abused: Not on file   Sexually Abused: Not on file    Family History  Problem Relation Age of Onset   Heart attack Mother        triple bypass at 28 years old     Past Medical History:  Diagnosis Date   Allergies    Dry eye syndrome    Fibromyalgia    GERD (gastroesophageal reflux disease)    High blood pressure    IBS (irritable bowel syndrome)     Migraines    Raynaud's disease    Scleroderma (HCC)    Wolff-Parkinson-White syndrome     Patient Active Problem List   Diagnosis Date Noted   Chronic migraine without aura, with intractable migraine, so stated, with status migrainosus 03/07/2020   Raynaud's disease    Migraines    Ingrown toenail 01/20/2020   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   Hemorrhoids 10/13/2018   History of colon polyps 10/13/2018   Cognitive complaints 05/14/2018   Other insomnia 05/14/2018   Chronic migraine w/o aura w/o status migrainosus, not intractable 05/13/2018   Reactive airway disease 04/13/2018   Scleroderma (HCC) 04/13/2018   Fibromyalgia 04/13/2018   Gastroesophageal reflux disease without esophagitis 04/13/2018   Raynaud's disease without gangrene 04/13/2018   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 10/28/2017   Renal cyst 08/18/2017   Spondylosis of lumbar region without myelopathy or radiculopathy 07/17/2017   Ischial bursitis of left side 07/01/2017   Sacroiliitis (HCC) 06/05/2017   IBS (irritable bowel syndrome) 04/17/2017   Thrombocytopenia (HCC) 04/17/2017   Low back pain 04/28/2016   Lumbar radiculopathy 04/28/2016   Acute confusional migraine, refractory 07/21/2014   Headache 04/28/2013   Spondylolisthesis at L5-S1 level 11/29/2012   Mild major depression (HCC) 10/16/2009   Encounter for screening for cardiovascular disorders 03/11/2009   HTN (hypertension) 10/08/2006   Raynaud's phenomenon 10/08/2006   Other allergic rhinitis 10/16/2001   Migraine 10/16/2001    Past Surgical History:  Procedure Laterality Date   BACK SURGERY     lumbar fusion   CESAREAN SECTION  1999 and 1998   NECK SURGERY     PARTIAL HYSTERECTOMY      Current Outpatient Medications  Medication Sig Dispense Refill   ALPRAZolam (XANAX) 1 MG tablet Take 1 mg by mouth 3 (three) times daily as needed.      doxepin (SINEQUAN) 10 MG capsule Take 10 mg by mouth at bedtime.      Efinaconazole  10 % SOLN Apply 1 drop topically daily. 4 mL 11   estrogens, conjugated, (PREMARIN) 0.625 MG tablet TAKE 1 TABLET BY MOUTH DAILY FOR 21 DAYS THEN DO NOT TAKE FOR 7 DAYS     metroNIDAZOLE (METROCREAM) 0.75 % cream Apply topically 2 (two) times daily.     pregabalin (LYRICA) 150 MG capsule Take 150 mg by mouth 3 (three) times daily.     rizatriptan (MAXALT) 10 MG tablet TAKE 1 TABLET AS NEEDED FOR MIGRAINE. MAY REPEAT IN 2 HOURS IF NEEDED     SAVELLA 50 MG TABS tablet Take 50 mg by mouth 2 (two) times daily.     sertraline (ZOLOFT) 100 MG tablet Take 100 mg by mouth daily.     topiramate (TOPAMAX) 100 MG tablet Take 100 mg by mouth 2 (two) times daily.     zolpidem (AMBIEN CR) 12.5 MG CR tablet Take 12.5 mg by mouth at bedtime as needed for sleep.     ondansetron (ZOFRAN-ODT) 4 MG disintegrating tablet Take 1-2 tablets (4-8 mg total) by mouth every 8 (eight) hours as needed for nausea. 30 tablet 3   Rimegepant Sulfate (NURTEC) 75 MG TBDP Take 75 mg by mouth every other day. For migraines. 16 tablet 6   No current facility-administered medications for this visit.    Allergies as of 03/07/2020 - Review Complete 03/07/2020  Allergen Reaction Noted   Codeine Nausea And Vomiting 12/03/1997   Gluten meal Other (See Comments) 10/28/2017   Lac bovis Other (See Comments) 03/05/2017   Milk-related compounds  04/13/2018    Vitals: BP (!) 145/92 (BP Location: Left Arm, Patient Position: Sitting)   Pulse 89   Ht 5\' 3"  (1.6 m)   Wt 121 lb (54.9 kg)   BMI 21.43 kg/m  Last Weight:  Wt Readings from Last 1 Encounters:  03/07/20 121 lb (54.9 kg)   Last Height:   Ht Readings from Last 1 Encounters:  03/07/20 5\' 3"  (1.6 m)     Physical exam: Exam: Gen: NAD, conversant, well nourised, well groomed, appears well, no headache                   CV: RRR, no MRG. No Carotid Bruits. No peripheral edema, warm, nontender Eyes: Conjunctivae clear without exudates or hemorrhage  Neuro: Detailed  Neurologic  Exam  Speech:    Speech is normal; fluent and spontaneous with normal comprehension.  Cognition:    The patient is oriented to person, place, and time;     recent and remote memory intact;     language fluent;     normal attention, concentration,     fund of knowledge Cranial Nerves:    The pupils are equal, round, and reactive to light. The fundi are flat.. Visual fields are full to finger confrontation. Extraocular movements are intact. Trigeminal sensation is intact and the muscles of mastication are normal. The face is symmetric. The palate elevates in the midline. Hearing intact. Voice is normal. Shoulder shrug is normal. The tongue has normal motion without fasciculations.   Coordination:    No dysmetria or ataxia  Gait:    Normal native gait  Motor Observation:    No asymmetry, no atrophy, and no involuntary movements noted. Tone:    Normal muscle tone.    Posture:    Posture is normal. normal erect    Strength:    Strength is V/V in the upper and lower limbs.      Sensation: intact to LT     Reflex Exam:  DTR's:    Deep tendon reflexes in the upper and lower extremities are normal bilaterally.   Toes:    The toes are downgoing bilaterally.   Clonus:    Clonus is absent.    Assessment/Plan:   Jennifer Reed is a 54 y.o. female here as requested by Adrienne Mocha, PA for intractable migraines.   - Patient has been to multiple neurologists, headache centers, spine specialists(neck pain) and currently in pain management. Has been under the care of multiple doctors for her migraines for over 10 years or much longer(was with a neurologist for 10 years in [place] and currently a patient at the [facility], saw neurology at [facility] as well see H&P). She has tried and failed multiple medications, is on Botox, and still states that she she has daily headache burden. - Today we discussed having an MRI of the brain, sleep evaluation(morning headaches),  continuing botox, trying Nurtec preventatively (do not take Nurtec with Ubrelvy) and gave her 6 months of Aimovig samples. - Morning headaches, wakes up with them, this is an indication for a sleep eval and sleep study. - I'm afraid choices are limited in this patient since she has already tried so much and we may want to discuss cognitive behavioral therapy, biofeedback and ask her to explore with a therapist any past traumatic or psychiatric history that may predispose her to intractable migraines. She was in no distress today and appeared very well. - I discussed medication overuse and advised her not to take acute management such as triptans, analgesics etc more than 10x a month - If she wants to switch her botox injections over to GNA, we can do that but I recommend she stays with current neurologist who is already providing botox and that our center does not have anything more to offer her at this time.  - I had a frank talk with patient that given her chronicity despite multiple specialist care I am not sure we are the right center for her and an academic center may have more to offer her, she declines.  - She uses doxepin, ambien for insomnia, I recommend sleep therapy and will not treat for insomnia or refill doxepin, feel therapy would be better for her health instead of more medication. Also on  xanax, these are all sedating meds and may cause morbidity or mortality if taken together. - Continue Topiramate, botox, lyrica, prevention. Startr Nurtec preventative.a -Continue maxalt acutely, may take zofran for nausea or migraine - She is seeing a cardiologist, treating HTN may also help with migraines, encouraged her to do so.    Orders Placed This Encounter  Procedures   MR BRAIN W WO CONTRAST   Basic Metabolic Panel   Ambulatory referral to Sleep Studies   Meds ordered this encounter  Medications   ondansetron (ZOFRAN-ODT) 4 MG disintegrating tablet    Sig: Take 1-2 tablets (4-8 mg  total) by mouth every 8 (eight) hours as needed for nausea.    Dispense:  30 tablet    Refill:  3   DISCONTD: Erenumab-aooe (AIMOVIG) 140 MG/ML SOAJ    Sig: Inject 140 mg into the skin every 30 (thirty) days.    Dispense:  6 mL    Refill:  0    Lot [id number] 4/23   Rimegepant Sulfate (NURTEC) 75 MG TBDP    Sig: Take 75 mg by mouth every other day. For migraines.    Dispense:  16 tablet    Refill:  6    Cc: Adrienne MochaQuinn, Kiera A, PA,  Adrienne MochaQuinn, Kiera A, [place]  Naomie DeanAntonia Irean Kendricks, MD  [facility] [address] [place], [place] [id number]  Phone [phone] Fax [phone]  I spent over 110 minutes of face-to-face and non-face-to-face time with patient on the  1. Chronic migraine without aura, with intractable migraine, so stated, with status migrainosus   2. Morning headache   3. Positional headache   4. Diplopia   5. Worsening headaches   6. Vision changes    diagnosis.  This included previsit extensive chart review, lab review, study review, order entry, electronic health record documentation, patient education on the different diagnostic and therapeutic options, counseling and coordination of care, risks and benefits of management, compliance, or risk factor reduction

## 2020-03-07 ENCOUNTER — Encounter: Payer: Self-pay | Admitting: *Deleted

## 2020-03-07 ENCOUNTER — Ambulatory Visit: Payer: Managed Care, Other (non HMO) | Admitting: Neurology

## 2020-03-07 ENCOUNTER — Other Ambulatory Visit: Payer: Self-pay

## 2020-03-07 ENCOUNTER — Telehealth: Payer: Self-pay | Admitting: Neurology

## 2020-03-07 ENCOUNTER — Encounter: Payer: Self-pay | Admitting: Neurology

## 2020-03-07 VITALS — BP 145/92 | HR 89 | Ht 63.0 in | Wt 121.0 lb

## 2020-03-07 DIAGNOSIS — R51 Headache with orthostatic component, not elsewhere classified: Secondary | ICD-10-CM

## 2020-03-07 DIAGNOSIS — G43711 Chronic migraine without aura, intractable, with status migrainosus: Secondary | ICD-10-CM | POA: Insufficient documentation

## 2020-03-07 DIAGNOSIS — H532 Diplopia: Secondary | ICD-10-CM | POA: Diagnosis not present

## 2020-03-07 DIAGNOSIS — R519 Headache, unspecified: Secondary | ICD-10-CM

## 2020-03-07 DIAGNOSIS — H539 Unspecified visual disturbance: Secondary | ICD-10-CM

## 2020-03-07 HISTORY — DX: Chronic migraine without aura, intractable, with status migrainosus: G43.711

## 2020-03-07 MED ORDER — NURTEC 75 MG PO TBDP: 75.0000 mg | ORAL_TABLET | ORAL | 6 refills | Status: DC

## 2020-03-07 MED ORDER — ONDANSETRON 4 MG PO TBDP
4.0000 mg | ORAL_TABLET | Freq: Three times a day (TID) | ORAL | 3 refills | Status: DC | PRN
Start: 2020-03-07 — End: 2021-06-12

## 2020-03-07 MED ORDER — AIMOVIG 140 MG/ML ~~LOC~~ SOAJ: 140.0000 mg | SUBCUTANEOUS | 0 refills | Status: DC

## 2020-03-07 NOTE — Progress Notes (Signed)
Source: referral notes from [place] at [facility].

## 2020-03-07 NOTE — Telephone Encounter (Signed)
cigna order sent to GI. They will obtain the auth and reach out to the patient to schedule.  °

## 2020-03-07 NOTE — Patient Instructions (Addendum)
MRI of the brain Sleep evaluation Aimovig Samples Continue Botox Continue Ubrelvy Nurtec every other day(do NOT use with ubrelvy)  Rimegepant oral dissolving tablet What is this medicine? RIMEGEPANT (ri ME je pant) is used to treat migraine headaches with or without aura. An aura is a strange feeling or visual disturbance that warns you of an attack. It is not used to prevent migraines. This medicine may be used for other purposes; ask your health care provider or pharmacist if you have questions. COMMON BRAND NAME(S): NURTEC ODT What should I tell my health care provider before I take this medicine? They need to know if you have any of these conditions:  kidney disease  liver disease  an unusual or allergic reaction to rimegepant, other medicines, foods, dyes, or preservatives  pregnant or trying to get pregnant  breast-feeding How should I use this medicine? Take the medicine by mouth. Follow the directions on the prescription label. Leave the tablet in the sealed blister pack until you are ready to take it. With dry hands, open the blister and gently remove the tablet. If the tablet breaks or crumbles, throw it away and take a new tablet out of the blister pack. Place the tablet in the mouth and allow it to dissolve, and then swallow. Do not cut, crush, or chew this medicine. You do not need water to take this medicine. Talk to your pediatrician about the use of this medicine in children. Special care may be needed. Overdosage: If you think you have taken too much of this medicine contact a poison control center or emergency room at once. NOTE: This medicine is only for you. Do not share this medicine with others. What if I miss a dose? This does not apply. This medicine is not for regular use. What may interact with this medicine? This medicine may interact with the following medications:  certain medicines for fungal infections like fluconazole, itraconazole  rifampin This  list may not describe all possible interactions. Give your health care provider a list of all the medicines, herbs, non-prescription drugs, or dietary supplements you use. Also tell them if you smoke, drink alcohol, or use illegal drugs. Some items may interact with your medicine. What should I watch for while using this medicine? Visit your health care professional for regular checks on your progress. Tell your health care professional if your symptoms do not start to get better or if they get worse. What side effects may I notice from receiving this medicine? Side effects that you should report to your doctor or health care professional as soon as possible:  allergic reactions like skin rash, itching or hives; swelling of the face, lips, or tongue Side effects that usually do not require medical attention (report these to your doctor or health care professional if they continue or are bothersome):  nausea This list may not describe all possible side effects. Call your doctor for medical advice about side effects. You may report side effects to FDA at 1-800-FDA-1088. Where should I keep my medicine? Keep out of the reach of children. Store at room temperature between 15 and 30 degrees C (59 and 86 degrees F). Throw away any unused medicine after the expiration date. NOTE: This sheet is a summary. It may not cover all possible information. If you have questions about this medicine, talk to your doctor, pharmacist, or health care provider.  2020 Elsevier/Gold Standard (2018-07-12 00:21:31) Erenumab injection What is this medicine? ERENUMAB (e REN ue mab) is used to  prevent migraine headaches. This medicine may be used for other purposes; ask your health care provider or pharmacist if you have questions. COMMON BRAND NAME(S): Aimovig What should I tell my health care provider before I take this medicine? They need to know if you have any of these conditions:  an unusual or allergic reaction to  erenumab, latex, other medicines, foods, dyes, or preservatives  high blood pressure  pregnant or trying to get pregnant  breast-feeding How should I use this medicine? This medicine is for injection under the skin. You will be taught how to prepare and give this medicine. Use exactly as directed. Take your medicine at regular intervals. Do not take your medicine more often than directed. It is important that you put your used needles and syringes in a special sharps container. Do not put them in a trash can. If you do not have a sharps container, call your pharmacist or healthcare provider to get one. Talk to your pediatrician regarding the use of this medicine in children. Special care may be needed. Overdosage: If you think you have taken too much of this medicine contact a poison control center or emergency room at once. NOTE: This medicine is only for you. Do not share this medicine with others. What if I miss a dose? If you miss a dose, take it as soon as you can. If it is almost time for your next dose, take only that dose. Do not take double or extra doses. What may interact with this medicine? Interactions are not expected. This list may not describe all possible interactions. Give your health care provider a list of all the medicines, herbs, non-prescription drugs, or dietary supplements you use. Also tell them if you smoke, drink alcohol, or use illegal drugs. Some items may interact with your medicine. What should I watch for while using this medicine? Tell your doctor or healthcare professional if your symptoms do not start to get better or if they get worse. What side effects may I notice from receiving this medicine? Side effects that you should report to your doctor or health care professional as soon as possible:  allergic reactions like skin rash, itching or hives, swelling of the face, lips, or tongue  chest pain  fast, irregular heartbeat  feeling faint or  lightheaded  palpitations Side effects that usually do not require medical attention (report these to your doctor or health care professional if they continue or are bothersome):  constipation  muscle cramps  pain, redness, or irritation at site where injected This list may not describe all possible side effects. Call your doctor for medical advice about side effects. You may report side effects to FDA at 1-800-FDA-1088. Where should I keep my medicine? Keep out of the reach of children. You will be instructed on how to store this medicine. Throw away any unused medicine after the expiration date on the label. NOTE: This sheet is a summary. It may not cover all possible information. If you have questions about this medicine, talk to your doctor, pharmacist, or health care provider.  2020 Elsevier/Gold Standard (2018-09-13 15:43:58)  Rimegepant oral dissolving tablet What is this medicine? RIMEGEPANT (ri ME je pant) is used to treat migraine headaches with or without aura. An aura is a strange feeling or visual disturbance that warns you of an attack. It is not used to prevent migraines. This medicine may be used for other purposes; ask your health care provider or pharmacist if you have questions. COMMON BRAND  NAME(S): NURTEC ODT What should I tell my health care provider before I take this medicine? They need to know if you have any of these conditions:  kidney disease  liver disease  an unusual or allergic reaction to rimegepant, other medicines, foods, dyes, or preservatives  pregnant or trying to get pregnant  breast-feeding How should I use this medicine? Take the medicine by mouth. Follow the directions on the prescription label. Leave the tablet in the sealed blister pack until you are ready to take it. With dry hands, open the blister and gently remove the tablet. If the tablet breaks or crumbles, throw it away and take a new tablet out of the blister pack. Place the  tablet in the mouth and allow it to dissolve, and then swallow. Do not cut, crush, or chew this medicine. You do not need water to take this medicine. Talk to your pediatrician about the use of this medicine in children. Special care may be needed. Overdosage: If you think you have taken too much of this medicine contact a poison control center or emergency room at once. NOTE: This medicine is only for you. Do not share this medicine with others. What if I miss a dose? This does not apply. This medicine is not for regular use. What may interact with this medicine? This medicine may interact with the following medications:  certain medicines for fungal infections like fluconazole, itraconazole  rifampin This list may not describe all possible interactions. Give your health care provider a list of all the medicines, herbs, non-prescription drugs, or dietary supplements you use. Also tell them if you smoke, drink alcohol, or use illegal drugs. Some items may interact with your medicine. What should I watch for while using this medicine? Visit your health care professional for regular checks on your progress. Tell your health care professional if your symptoms do not start to get better or if they get worse. What side effects may I notice from receiving this medicine? Side effects that you should report to your doctor or health care professional as soon as possible:  allergic reactions like skin rash, itching or hives; swelling of the face, lips, or tongue Side effects that usually do not require medical attention (report these to your doctor or health care professional if they continue or are bothersome):  nausea This list may not describe all possible side effects. Call your doctor for medical advice about side effects. You may report side effects to FDA at 1-800-FDA-1088. Where should I keep my medicine? Keep out of the reach of children. Store at room temperature between 15 and 30 degrees C  (59 and 86 degrees F). Throw away any unused medicine after the expiration date. NOTE: This sheet is a summary. It may not cover all possible information. If you have questions about this medicine, talk to your doctor, pharmacist, or health care provider.  2020 Elsevier/Gold Standard (2018-07-12 00:21:31) Erenumab injection What is this medicine? ERENUMAB (e REN ue mab) is used to prevent migraine headaches. This medicine may be used for other purposes; ask your health care provider or pharmacist if you have questions. COMMON BRAND NAME(S): Aimovig What should I tell my health care provider before I take this medicine? They need to know if you have any of these conditions:  an unusual or allergic reaction to erenumab, latex, other medicines, foods, dyes, or preservatives  high blood pressure  pregnant or trying to get pregnant  breast-feeding How should I use this medicine? This medicine  is for injection under the skin. You will be taught how to prepare and give this medicine. Use exactly as directed. Take your medicine at regular intervals. Do not take your medicine more often than directed. It is important that you put your used needles and syringes in a special sharps container. Do not put them in a trash can. If you do not have a sharps container, call your pharmacist or healthcare provider to get one. Talk to your pediatrician regarding the use of this medicine in children. Special care may be needed. Overdosage: If you think you have taken too much of this medicine contact a poison control center or emergency room at once. NOTE: This medicine is only for you. Do not share this medicine with others. What if I miss a dose? If you miss a dose, take it as soon as you can. If it is almost time for your next dose, take only that dose. Do not take double or extra doses. What may interact with this medicine? Interactions are not expected. This list may not describe all possible  interactions. Give your health care provider a list of all the medicines, herbs, non-prescription drugs, or dietary supplements you use. Also tell them if you smoke, drink alcohol, or use illegal drugs. Some items may interact with your medicine. What should I watch for while using this medicine? Tell your doctor or healthcare professional if your symptoms do not start to get better or if they get worse. What side effects may I notice from receiving this medicine? Side effects that you should report to your doctor or health care professional as soon as possible:  allergic reactions like skin rash, itching or hives, swelling of the face, lips, or tongue  chest pain  fast, irregular heartbeat  feeling faint or lightheaded  palpitations Side effects that usually do not require medical attention (report these to your doctor or health care professional if they continue or are bothersome):  constipation  muscle cramps  pain, redness, or irritation at site where injected This list may not describe all possible side effects. Call your doctor for medical advice about side effects. You may report side effects to FDA at 1-800-FDA-1088. Where should I keep my medicine? Keep out of the reach of children. You will be instructed on how to store this medicine. Throw away any unused medicine after the expiration date on the label. NOTE: This sheet is a summary. It may not cover all possible information. If you have questions about this medicine, talk to your doctor, pharmacist, or health care provider.  2020 Elsevier/Gold Standard (2018-09-13 15:43:58) Ondansetron oral dissolving tablet What is this medicine? ONDANSETRON (on DAN se tron) is used to treat nausea and vomiting caused by chemotherapy. It is also used to prevent or treat nausea and vomiting after surgery. This medicine may be used for other purposes; ask your health care provider or pharmacist if you have questions. COMMON BRAND NAME(S):  Zofran ODT What should I tell my health care provider before I take this medicine? They need to know if you have any of these conditions:  heart disease  history of irregular heartbeat  liver disease  low levels of magnesium or potassium in the blood  an unusual or allergic reaction to ondansetron, granisetron, other medicines, foods, dyes, or preservatives  pregnant or trying to get pregnant  breast-feeding How should I use this medicine? These tablets are made to dissolve in the mouth. Do not try to push the tablet through the  foil backing. With dry hands, peel away the foil backing and gently remove the tablet. Place the tablet in the mouth and allow it to dissolve, then swallow. While you may take these tablets with water, it is not necessary to do so. Talk to your pediatrician regarding the use of this medicine in children. Special care may be needed. Overdosage: If you think you have taken too much of this medicine contact a poison control center or emergency room at once. NOTE: This medicine is only for you. Do not share this medicine with others. What if I miss a dose? If you miss a dose, take it as soon as you can. If it is almost time for your next dose, take only that dose. Do not take double or extra doses. What may interact with this medicine? Do not take this medicine with any of the following medications:  apomorphine  certain medicines for fungal infections like fluconazole, itraconazole, ketoconazole, posaconazole, voriconazole  cisapride  dronedarone  pimozide  thioridazine This medicine may also interact with the following medications:  carbamazepine  certain medicines for depression, anxiety, or psychotic disturbances  fentanyl  linezolid  MAOIs like Carbex, Eldepryl, Marplan, Nardil, and Parnate  methylene blue (injected into a vein)  other medicines that prolong the QT interval (cause an abnormal heart rhythm) like dofetilide,  ziprasidone  phenytoin  rifampicin  tramadol This list may not describe all possible interactions. Give your health care provider a list of all the medicines, herbs, non-prescription drugs, or dietary supplements you use. Also tell them if you smoke, drink alcohol, or use illegal drugs. Some items may interact with your medicine. What should I watch for while using this medicine? Check with your doctor or health care professional as soon as you can if you have any sign of an allergic reaction. What side effects may I notice from receiving this medicine? Side effects that you should report to your doctor or health care professional as soon as possible:  allergic reactions like skin rash, itching or hives, swelling of the face, lips, or tongue  breathing problems  confusion  dizziness  fast or irregular heartbeat  feeling faint or lightheaded, falls  fever and chills  loss of balance or coordination  seizures  sweating  swelling of the hands and feet  tightness in the chest  tremors  unusually weak or tired Side effects that usually do not require medical attention (report to your doctor or health care professional if they continue or are bothersome):  constipation or diarrhea  headache This list may not describe all possible side effects. Call your doctor for medical advice about side effects. You may report side effects to FDA at 1-800-FDA-1088. Where should I keep my medicine? Keep out of the reach of children. Store between 2 and 30 degrees C (36 and 86 degrees F). Throw away any unused medicine after the expiration date. NOTE: This sheet is a summary. It may not cover all possible information. If you have questions about this medicine, talk to your doctor, pharmacist, or health care provider.  2020 Elsevier/Gold Standard (2018-04-20 07:14:10)

## 2020-03-08 LAB — BASIC METABOLIC PANEL
BUN/Creatinine Ratio: 7 — ABNORMAL LOW (ref 9–23)
BUN: 6 mg/dL (ref 6–24)
CO2: 24 mmol/L (ref 20–29)
Calcium: 8.8 mg/dL (ref 8.7–10.2)
Chloride: 105 mmol/L (ref 96–106)
Creatinine, Ser: 0.81 mg/dL (ref 0.57–1.00)
GFR calc Af Amer: 97 mL/min/{1.73_m2} (ref >59)
GFR calc non Af Amer: 84 mL/min/{1.73_m2} (ref >59)
Glucose: 81 mg/dL (ref 65–99)
Potassium: 3.8 mmol/L (ref 3.5–5.2)
Sodium: 139 mmol/L (ref 134–144)

## 2020-03-08 NOTE — Telephone Encounter (Signed)
Called patient and informed her of Dr. Vanetta Shawl note and that he will explain further at visit. She verbally understood no further questions.

## 2020-03-08 NOTE — Telephone Encounter (Signed)
There is also only mild AI which is not concerning, will discuss details during the visit.

## 2020-03-09 ENCOUNTER — Other Ambulatory Visit (HOSPITAL_BASED_OUTPATIENT_CLINIC_OR_DEPARTMENT_OTHER): Payer: Managed Care, Other (non HMO)

## 2020-03-12 ENCOUNTER — Telehealth: Payer: Self-pay | Admitting: Neurology

## 2020-03-12 NOTE — Telephone Encounter (Signed)
[facility]) called, verify you have received form and fill out and fax back. Fax no: [phone]

## 2020-03-12 NOTE — Telephone Encounter (Signed)
PA for Nurtec received. Will work on this as soon as possible.

## 2020-03-15 ENCOUNTER — Institutional Professional Consult (permissible substitution): Payer: Managed Care, Other (non HMO) | Admitting: Neurology

## 2020-03-15 NOTE — Telephone Encounter (Signed)
[facility] called to verify if we had received PA for nurtec. Read telephone notes & told rep. we have.

## 2020-03-15 NOTE — Telephone Encounter (Signed)
PA form completed, signed, and faxed back to [facility] pharmacy along with office note, insurance info and lab results. Received a receipt of confirmation.

## 2020-03-20 NOTE — Telephone Encounter (Signed)
We received a determination from [facility]. They denied the Nurtec because they only go by the guidelines of acute treatment with Nurtec instead of prevention. The denial also stated nurtec is not approved for the requested indication when the pt is on Botox and CGRP for prevention. If we should choose to appeal, fax to [phone]. Patient has to sign the appeal form if designating doctor to submit. I faxed the denial to [facility] pharmacy as they have a savings program in place that is dependent on this PA submission. Faxed denial to [facility] and requested they setup patient for savings program. Received a receipt of confirmation.

## 2020-03-21 ENCOUNTER — Encounter: Payer: Self-pay | Admitting: *Deleted

## 2020-03-22 NOTE — Telephone Encounter (Signed)
[facility]) called, verify if physician going to appeal prior authorization for Nurtec. Contact info: [phone], option 2.

## 2020-03-22 NOTE — Telephone Encounter (Signed)
We received the request on paper. I need to d/w patient as she has to authorize provider to appeal or not.

## 2020-03-24 ENCOUNTER — Other Ambulatory Visit: Payer: Self-pay

## 2020-03-24 ENCOUNTER — Ambulatory Visit
Admission: RE | Admit: 2020-03-24 | Discharge: 2020-03-24 | Disposition: A | Discharge status: Home / Self Care | Payer: Managed Care, Other (non HMO) | Source: Ambulatory Visit | Attending: Neurology | Admitting: Neurology

## 2020-03-24 DIAGNOSIS — H532 Diplopia: Secondary | ICD-10-CM

## 2020-03-24 DIAGNOSIS — R519 Headache, unspecified: Secondary | ICD-10-CM | POA: Diagnosis not present

## 2020-03-24 DIAGNOSIS — R51 Headache with orthostatic component, not elsewhere classified: Secondary | ICD-10-CM

## 2020-03-24 DIAGNOSIS — H539 Unspecified visual disturbance: Secondary | ICD-10-CM | POA: Diagnosis not present

## 2020-03-24 MED ORDER — GADOBENATE DIMEGLUMINE 529 MG/ML IV SOLN
11.0000 mL | Freq: Once | INTRAVENOUS | Status: AC | PRN
  Administered 2020-03-24: 11 mL via INTRAVENOUS

## 2020-04-10 ENCOUNTER — Institutional Professional Consult (permissible substitution): Payer: Managed Care, Other (non HMO) | Admitting: Neurology

## 2020-04-19 NOTE — Telephone Encounter (Signed)
I spoke with patient on 04/18/2020. She thinks the Nurtec may be helping her. She provided verbal consent for our office to appeal the Nurtec denial. She will sign a form on Monday if signature is necessary.   I spoke with [facility] representative on 04/18/20 who confirmed the patient actually does not need to provide consent for the provider (only if it was someone else) as the provider is the preferred person to appeal.   Appeal compiled (letter signed by Dr Lucia Gaskins) and faxed to [facility] who will submit to [facility] plan our behalf. Received a receipt of confirmation.

## 2020-04-23 ENCOUNTER — Ambulatory Visit (INDEPENDENT_AMBULATORY_CARE_PROVIDER_SITE_OTHER): Payer: Managed Care, Other (non HMO) | Admitting: Neurology

## 2020-04-23 ENCOUNTER — Encounter: Payer: Self-pay | Admitting: Neurology

## 2020-04-23 VITALS — BP 127/83 | HR 86 | Ht 63.5 in | Wt 124.0 lb

## 2020-04-23 DIAGNOSIS — G4709 Other insomnia: Secondary | ICD-10-CM | POA: Diagnosis not present

## 2020-04-23 DIAGNOSIS — G43711 Chronic migraine without aura, intractable, with status migrainosus: Secondary | ICD-10-CM

## 2020-04-23 NOTE — Progress Notes (Signed)
SLEEP MEDICINE CLINIC    Provider:  Melvyn Novasarmen  Estalene Bergey, MD  Primary Care Physician:  Adrienne MochaQuinn, Kiera A, PA [address]  [place] [id number]     Referring Provider: Dr Lucia GaskinsAhern, MD           Chief Complaint according to patient   Patient presents with:    . New Patient (Initial Visit)           HISTORY OF PRESENT ILLNESS:  Jennifer Reed is a 54 - year- old Caucasian, right handed  female patient  and is seen here in CONSULTATION  Requested by Dr Lucia GaskinsAhern. Seen on 04/23/2020 from Dr Lucia GaskinsAhern.   Chief concern according to patient : see below - Dr Trevor MaceAhern's note-     Jennifer Reed   She has a  has a past medical history of Allergies, Dry eye syndrome,  GERD (gastroesophageal reflux disease), High blood pressure, IBS (irritable bowel syndrome), chronic Migraines, Raynaud's disease, Scleroderma (HCC), and was told she does not have Wolff-Parkinson-White syndrome, only a murmur.she contracted COVID 19 at Thanksgiving 2020, flying to [place] to meet the daughters fiancee.   Dr Trevor MaceAhern's note- :  Jennifer Reed is a 54 y.o. female here as requested by Adrienne MochaQuinn, Kiera A, PA for migraines.  Past medical history migraine without aura, major depressive disorder recurrent moderate, scleroderma, left renal mass, rosacea, fibromyalgia, anxiety, Raynaud's phenomena, primary insomnia.  I reviewed Jennifer Reed's notes: It appears patient is already on Aimovig, sending to us here for evaluation of Botox injections for migraines, general examination was normal including nose ears eyes throat, lungs, heart, musculoskeletal, psychiatric and neurologic, no details on patient's migraine disorder provided which would be helpful when referring to a specialist.  I was able to review epic notes, patient was last seen at [facility] for Botox injections just a few days ago, unclear why she is being sent here for Botox if she is already seen at a headache clinic in the area.  It appears she is already tried  Flexeril, tizanidine, Topamax, Trokendi, gabapentin, Lyrica, amitriptyline, Celexa, Lexapro, fluoxetine, Paxil, Zoloft, Cymbalta, Savella, doxepin, Aimovig, Botox, dry needling, yoga, acupuncture, trigger point injections and Botox.  She has also been seen in neurosurgery earlier this month for chronic neck pain.  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  Echo notes going back to at least 2008 for her migraines.  Patient is here alone, as above she is already a patient at a headache clinic and received botox and is on CGRP. She does [facility]. They are working on her posture, dry needling. She has such huge knots. She has been to pain management and continues to go, she has had steroid injections. She was in [place] and had a neurologist for 10 years, she has been here 3 years, she has [facility]. She does not know what she wants to focus on today, she wakes  up with migraines, she has difficulty falling asleep and she can't stay asleep, she wakes up with headaches 4-5 times a week. She loved Aimovig* but insurance wouldn't pay for both. She felt being on both helped. She has TMJ. She treats her headaches with tylenol, tried [place]. She has at least one migraine that puts her in bed for 3-4 days severe but she also has moderately severe migraines al most every day. She has a headache every day. No aura. NO medication overuse. She does not know what she wants to focus on today, she wakes up with  migraines, she has difficulty falling asleep and she can't stay asleep, she wakes up with headaches 4-5 times a week. She has new diplopia.    It appears she is already tried Flexeril, tizanidine, Topamax, Trokendi, gabapentin, Lyrica, amitriptyline, Celexa, Lexapro, fluoxetine, Paxil, Zoloft, Cymbalta, Savella, doxepin, Aimovig, Botox, dry needling, yoga, acupuncture, trigger point injections and Botox, Maxalt, Fioricet, propranolol, amlodipine    Family medical /sleep history: No other family member with OSA, insomnia, sleep walkers. she has no contact with her family.    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 Living with spouse and a new puppy and mother- in -law. The couple has 2 grown children, a daughter is 47 in [place], her son turning 57 in [place].   Tobacco use: none.  ETOH use - rarely, Caffeine intake ; none. . Regular exercise in form of  Walking.     Sleep habits are as follows:  The patient's dinner time is between 6-6.30 PM. The patient goes to bed at 11PM and has great difficulties to go to sleep and to stay asleep. Doxepin for a sleep aid-  She continues to sleep for intervals of 2 hours, wakes not for bathroom breaks. sometimes she has panic attacks in the middle of the night- about once a week- wakes up startled.  Her husband has caught her with the phone in her hand- she cannot recall that she used it.  The preferred sleep position is on either side , with the support of 3-4 pillows. Dreams are reportedly  frequent/vivid.  9.00 AM is the usual rise time ( late ).  The patient wakes up spontaneously at that time.  She reports not feeling refreshed or restored in AM, with symptoms such as dry mouth , morning headaches, and residual fatigue.  She used to have right temporal-facial pain , waking her in AM - this was before the last month start of a new med-currently helped by NURTEC.  Naps are  taken frequently, lasting from 30-60 minutes and are not refreshing .  She just felt better on NURTEC.    Review of Systems: Out of a complete 14 system review, the patient complains of only the following symptoms, and all other reviewed systems are negative.:  Fatigue, sleepiness , snoring, fragmented sleep, Insomnia - and headaches.   Spends 10 hours or more in bed for a total sleep time- of estimated 7 hours   How likely are you to doze in the following situations: 0 = not likely, 1 = slight chance, 2 = moderate chance, 3 = high chance   Sitting and Reading? Watching Television? Sitting inactive in a public place (theater or meeting)? As a passenger in a car for an hour without a break? Lying down in the afternoon when circumstances permit? Sitting and talking to someone? Sitting quietly after  lunch without alcohol? In a car, while stopped for a few minutes in traffic?   Total = 17/ 24 points   FSS endorsed at 43/ 63 points.   Social History   Socioeconomic History  . Marital status: Married    Spouse name: Not on file  . Number of children: 2  . Years of education: Not on file  . Highest education level: Not on file  Occupational History  . Not on file  Tobacco Use  . Smoking status: Never Smoker  . Smokeless tobacco: Never Used  Vaping Use  . Vaping Use: Never used  Substance and Sexual Activity  . Alcohol use: Yes    Comment: social drinks rarely ("like a cocktail a month")  . Drug use: Never  . Sexual activity: Not on file  Other Topics Concern  . Not on file  Social History Narrative   Lives at home with husband and dog   Right handed   Caffeine: n/a    Social Determinants of Health   Financial Resource Strain: Not on file  Food Insecurity: Not on file  Transportation Needs: Not on file  Physical Activity: Not on file  Stress: Not on file  Social Connections: Not on file    Family History  Problem Relation Age of Onset  . Heart attack Mother         triple bypass at 40 years old     Past Medical History:  Diagnosis Date  . Allergies   . Dry eye syndrome   . Fibromyalgia   . GERD (gastroesophageal reflux disease)   . High blood pressure   . IBS (irritable bowel syndrome)   . Migraines   . Raynaud's disease   . Scleroderma (HCC)   . Wolff-Parkinson-White syndrome     Past Surgical History:  Procedure Laterality Date  . BACK SURGERY     lumbar fusion  . CESAREAN SECTION  1999 and 1998  . NECK SURGERY    . PARTIAL HYSTERECTOMY       Current Outpatient Medications on File Prior to Visit  Medication Sig Dispense Refill  . ALPRAZolam (XANAX) 1 MG tablet Take 1 mg by mouth 3 (three) times daily as needed.     . doxepin (SINEQUAN) 10 MG capsule Take 10 mg by mouth at bedtime.     . metroNIDAZOLE (METROCREAM) 0.75 % cream Apply topically 2 (two) times daily.    . ondansetron (ZOFRAN-ODT) 4 MG disintegrating tablet Take 1-2 tablets (4-8 mg total) by mouth every 8 (eight) hours as needed for nausea. 30 tablet 3  . pregabalin (LYRICA) 150 MG capsule Take 150 mg by mouth 3 (three) times daily.    . Rimegepant Sulfate (NURTEC) 75 MG TBDP Take 75 mg by mouth every other day. For migraines. 16 tablet 6  . rizatriptan (MAXALT) 10 MG tablet TAKE 1 TABLET AS NEEDED FOR MIGRAINE. MAY REPEAT IN 2 HOURS IF NEEDED    . SAVELLA 50 MG TABS tablet Take 50 mg by mouth 2 (two) times daily.    . sertraline (ZOLOFT) 100 MG tablet Take 100 mg by mouth daily.    Marland Kitchen topiramate (TOPAMAX) 100 MG tablet Take 100 mg by mouth 2 (two) times daily.    Marland Kitchen zolpidem (AMBIEN CR) 12.5 MG CR tablet Take 12.5 mg by mouth at bedtime as needed for sleep.     No current facility-administered medications on file prior to visit.    Allergies  Allergen Reactions  . Codeine Nausea And  Vomiting  . Gluten Meal Other (See Comments)  . Lac Bovis Other (See Comments)    GI Upset GI Upset   . Milk-Related Compounds     Physical exam:  Today's Vitals   04/23/20 1018   BP: 127/83  Pulse: 86  Weight: 124 lb (56.2 kg)  Height: 5' 3.5" (1.613 m)   Body mass index is 21.62 kg/m.   Wt Readings from Last 3 Encounters:  04/23/20 124 lb (56.2 kg)  03/07/20 121 lb (54.9 kg)  02/17/20 118 lb 1.9 oz (53.6 kg)     Ht Readings from Last 3 Encounters:  04/23/20 5' 3.5" (1.613 m)  03/07/20 5\' 3"  (1.6 m)  02/17/20 5\' 4"  (1.626 m)      General: The patient is awake, alert and appears not in acute distress. The patient is well groomed. Head: Normocephalic, atraumatic. Neck is supple. Mallampati 1,  neck circumference:12 inches . Nasal airflow  patent.  Retrognathia is not she has limited capacity to open the mouth," locks" seen.  Dental status: TMJ on night guard.  Cardiovascular:  Regular rate and cardiac rhythm by pulse,  without distended neck veins. Respiratory: Lungs are clear to auscultation.  Skin:  Without evidence of ankle edema, or rash. Trunk: The patient's posture is erect.   Neurologic exam : The patient is awake and alert, oriented to place and time.   Memory subjective described as : Brain fog and hair loss since COVID- Attention span & concentration ability appears normal.  Speech is fluent,  without  dysarthria, dysphonia or aphasia.  Mood and affect are appropriate.   Cranial nerves:  loss of smell or taste reported- late NOV and December 2020, but now recovered.  Pupils are equal and briskly reactive to light. Funduscopic exam deferred. .  Extraocular movements in vertical and horizontal planes were intact and without nystagmus. No Diplopia. Visual fields by finger perimetry are intact. Hearing was intact to soft voice and finger rubbing.    Facial sensation intact to fine touch.  Facial motor strength is symmetric and tongue and uvula move midline.  Neck ROM : rotation, tilt and flexion extension were normal for age and shoulder shrug was symmetrical.    Motor exam:  Symmetric bulk, tone and ROM.   Normal tone without cog wheeling,  symmetric grip strength .   Sensory:  Fine touch and vibration were intact here but the patient reported numbness in left foot and right hand- Raynaud's (?).  Proprioception tested in the upper extremities was normal.   Coordination: Rapid alternating movements in the fingers/hands were of normal speed.  The Finger-to-nose maneuver was intact without evidence of ataxia, dysmetria or tremor.   Gait and station: Patient could rise unassisted from a seated position, walked without assistive device.  Stance is of normal width/ base .  Toe and heel walk were deferred.  Deep tendon reflexes: in the upper and lower extremities are brisk, symmetric and intact.  Babinski response was deferred.         After spending a total time of 40 minutes face to face and additional time for physical and neurologic examination, review of laboratory studies,  personal review of imaging studies, reports and results of other testing and review of referral information / records as far as provided in visit, I have established the following assessments:  1) Jennifer Reed has a longstanding history of headaches, some of them are cluster type headaches or more cluster type headaches and she has just had a  reprieve for the last 3 weeks or so, attributed to Nurtec.  However she has consistent insomnia with an inability to sleep without sleep fragmentation.  Doxepin has helped and she achieved 7 sometimes 8 hours of sleep.  She was concerned about doxepin being habit-forming which it is not and I assured her of that.  In terms of the cluster description I would like to check her recent sleep study not so much for the presence of sleep apnea but for sleep hypoxia.  It is usually low oxygen levels in slow-wave sleep that promote the onset of clusters and often within the first 2 or 3 hours of sleep.  We discussed briefly some sleep hygiene and I am happy to see that she is not looking at the clock at night, but her bedroom is cool  quiet dark she is not watching TV in bed etc.  She is not using caffeine.  I will order a home sleep test which allows me to screen for hypoxemia and at least gives me an estimate of how many hours she truly sleeps.  The results will be shared with the patient and with Dr. Chase Picket as her referring physician.  If a sleep disorder is identified to be will follow up with in the sleep clinic.    My Plan is to proceed with:  1) will order a home sleep test which allows me to screen for hypoxemia and at least gives me an estimate of how many hours she truly sleeps.  The results will be shared with the patient and with Dr. Chase Picket as her referring physician.  If a sleep disorder is identified to be will follow up with in the sleep clinic.   I would like to thank  Adrienne Mocha, Pa [address]  [place],  [place] [address] for allowing me to meet with and to take care of this pleasant patient.   In short, Jennifer Reed is presenting with chronic insomnia for about a decade. Migraine and cluster headaches can indicate a sleep disorder related to hypoxia.  I plan to follow up either personally or through our NP within 3-4 month should the HST be positive.   CC: I will share my notes with PCP as well.   Electronically signed by: Melvyn Novas, MD 04/23/2020 10:23 AM  [facility] Board certified by The [employer] of Sleep Medicine and Diplomate of the [employer] of Sleep Medicine. Board certified In Neurology through the ABPN, Fellow of the [place] [employer] of Neurology. Medical Director of [facility].

## 2020-04-23 NOTE — Patient Instructions (Signed)

## 2020-05-07 ENCOUNTER — Encounter: Payer: Self-pay | Admitting: Cardiology

## 2020-05-07 ENCOUNTER — Other Ambulatory Visit: Payer: Self-pay

## 2020-05-07 ENCOUNTER — Ambulatory Visit (INDEPENDENT_AMBULATORY_CARE_PROVIDER_SITE_OTHER): Payer: Managed Care, Other (non HMO) | Admitting: Cardiology

## 2020-05-07 VITALS — BP 148/80 | HR 77 | Ht 63.5 in | Wt 123.8 lb

## 2020-05-07 DIAGNOSIS — I10 Essential (primary) hypertension: Secondary | ICD-10-CM | POA: Diagnosis not present

## 2020-05-07 DIAGNOSIS — M359 Systemic involvement of connective tissue, unspecified: Secondary | ICD-10-CM

## 2020-05-07 DIAGNOSIS — L719 Rosacea, unspecified: Secondary | ICD-10-CM

## 2020-05-07 DIAGNOSIS — M5416 Radiculopathy, lumbar region: Secondary | ICD-10-CM

## 2020-05-07 DIAGNOSIS — M47816 Spondylosis without myelopathy or radiculopathy, lumbar region: Secondary | ICD-10-CM

## 2020-05-07 DIAGNOSIS — F331 Major depressive disorder, recurrent, moderate: Secondary | ICD-10-CM | POA: Insufficient documentation

## 2020-05-07 DIAGNOSIS — N281 Cyst of kidney, acquired: Secondary | ICD-10-CM

## 2020-05-07 DIAGNOSIS — F419 Anxiety disorder, unspecified: Secondary | ICD-10-CM

## 2020-05-07 DIAGNOSIS — I7300 Raynaud's syndrome without gangrene: Secondary | ICD-10-CM

## 2020-05-07 DIAGNOSIS — G43711 Chronic migraine without aura, intractable, with status migrainosus: Secondary | ICD-10-CM | POA: Diagnosis not present

## 2020-05-07 DIAGNOSIS — M4317 Spondylolisthesis, lumbosacral region: Secondary | ICD-10-CM

## 2020-05-07 DIAGNOSIS — M461 Sacroiliitis, not elsewhere classified: Secondary | ICD-10-CM

## 2020-05-07 DIAGNOSIS — M7072 Other bursitis of hip, left hip: Secondary | ICD-10-CM

## 2020-05-07 DIAGNOSIS — L600 Ingrowing nail: Secondary | ICD-10-CM

## 2020-05-07 HISTORY — DX: Rosacea, unspecified: L71.9

## 2020-05-07 HISTORY — DX: Anxiety disorder, unspecified: F41.9

## 2020-05-07 HISTORY — DX: Major depressive disorder, recurrent, moderate: F33.1

## 2020-05-07 MED ORDER — DILTIAZEM HCL ER COATED BEADS 120 MG PO CP24: 120.0000 mg | ORAL_CAPSULE | Freq: Every day | ORAL | 3 refills | Status: DC

## 2020-05-07 NOTE — Patient Instructions (Signed)
Medication Instructions:  Your physician has recommended you make the following change in your medication:  START: Diltiazem 120 mg take one tablet by mouth daily.  *If you need a refill on your cardiac medications before your next appointment, please call your pharmacy*   Lab Work: None If you have labs (blood work) drawn today and your tests are completely normal, you will receive your results only by: Marland Kitchen MyChart Message (if you have MyChart) OR . A paper copy in the mail If you have any lab test that is abnormal or we need to change your treatment, we will call you to review the results.   Testing/Procedures: None   Follow-Up: At [facility], you and your health needs are our priority.  As part of our continuing mission to provide you with exceptional heart care, we have created designated Provider Care Teams.  These Care Teams include your primary Cardiologist (physician) and Advanced Practice Providers (APPs -  Physician Assistants and Nurse Practitioners) who all work together to provide you with the care you need, when you need it.  We recommend signing up for the patient portal called "MyChart".  Sign up information is provided on this After Visit Summary.  MyChart is used to connect with patients for Virtual Visits (Telemedicine).  Patients are able to view lab/test results, encounter notes, upcoming appointments, etc.  Non-urgent messages can be sent to your provider as well.   To learn more about what you can do with MyChart, go to [email or web address].    Your next appointment:   5 month(s)  The format for your next appointment:   In Person  Provider:   Gypsy Balsam, MD   Other Instructions

## 2020-05-07 NOTE — Addendum Note (Signed)
Addended by: Delorse Limber I on: 05/07/2020 04:44 PM   Modules accepted: Orders

## 2020-05-07 NOTE — Progress Notes (Signed)
Cardiology Office Note:    Date:  05/07/2020   ID:  Jennifer Reed, DOB 20-Jun-1965, MRN [id number]  PCP:  Adrienne Mocha, PA  Cardiologist:  Gypsy Balsam, MD    Referring MD: Adrienne Mocha, PA   No chief complaint on file. I am doing fine  History of Present Illness:    Jennifer Reed is a 54 y.o. female with past medical history significant for Raynaud's phenomenon, fibromyalgia, migraine, essential hypertension was referred to me because of difficulty controlling her blood pressure.  We did an echocardiogram to assess left ventricle ejection fraction which was normal as well as look at left ventricle hypertrophy that she does not have any.  She did have only mild aortic insufficiency.  She comes today 2 months of follow-up overall she is doing fine still complain of having really not been normal especially when it is cold.  She also concerned about her blood pressure I think we reached the point a small dose of medication may be beneficial.  Initially she was given amlodipine/Norvasc however could not tolerate this you feel weak tired exhausted.  However because of Raynaud's phenomenon I will try to have difficulty with blocker.  I will try to put her on diltiazem CD 120 I will ask you to have EKG done if EKG is fine will try that medication.  I warned her about potential side effects let me know how she feels.  Past Medical History:  Diagnosis Date  . Allergies   . Dry eye syndrome   . Fibromyalgia   . GERD (gastroesophageal reflux disease)   . High blood pressure   . IBS (irritable bowel syndrome)   . Migraines   . Raynaud's disease   . Scleroderma (HCC)   . Wolff-Parkinson-White syndrome     Past Surgical History:  Procedure Laterality Date  . BACK SURGERY     lumbar fusion  . CESAREAN SECTION  1999 and 1998  . NECK SURGERY    . PARTIAL HYSTERECTOMY      Current Medications: Current Meds  Medication Sig  . ALPRAZolam (XANAX) 1 MG tablet Take 1 mg by mouth 3 (three)  times daily as needed.   Marland Kitchen amLODipine (NORVASC) 2.5 MG tablet Take 2.5 mg by mouth daily.  . Botulinum Toxin Type A (BOTOX) 200 units SOLR Inject 200 Units into the skin every 30 (thirty) days.  Marland Kitchen doxepin (SINEQUAN) 10 MG capsule Take 10 mg by mouth at bedtime.   Marland Kitchen estradiol (VIVELLE-DOT) 0.075 MG/24HR Place 1 patch onto the skin every other day.  . metroNIDAZOLE (METROCREAM) 0.75 % cream Apply topically 2 (two) times daily.  . ondansetron (ZOFRAN-ODT) 4 MG disintegrating tablet Take 1-2 tablets (4-8 mg total) by mouth every 8 (eight) hours as needed for nausea.  . pregabalin (LYRICA) 150 MG capsule Take 150 mg by mouth 3 (three) times daily.  . Rimegepant Sulfate (NURTEC) 75 MG TBDP Take 75 mg by mouth every other day. For migraines.  . rizatriptan (MAXALT) 10 MG tablet TAKE 1 TABLET AS NEEDED FOR MIGRAINE. MAY REPEAT IN 2 HOURS IF NEEDED  . SAVELLA 50 MG TABS tablet Take 50 mg by mouth 2 (two) times daily.  . sertraline (ZOLOFT) 100 MG tablet Take 100 mg by mouth daily.  Marland Kitchen topiramate (TOPAMAX) 100 MG tablet Take 100 mg by mouth 2 (two) times daily.  Marland Kitchen UBRELVY 100 MG TABS Take 100 mg by mouth daily.  Marland Kitchen zolpidem (AMBIEN CR) 12.5 MG CR tablet Take 12.5 mg by  mouth at bedtime as needed for sleep.     Allergies:   Codeine, Gluten meal, Lac bovis, and Milk-related compounds   Social History   Socioeconomic History  . Marital status: Married    Spouse name: Not on file  . Number of children: 2  . Years of education: Not on file  . Highest education level: Not on file  Occupational History  . Not on file  Tobacco Use  . Smoking status: Never Smoker  . Smokeless tobacco: Never Used  Vaping Use  . Vaping Use: Never used  Substance and Sexual Activity  . Alcohol use: Yes    Comment: social drinks rarely ("like a cocktail a month")  . Drug use: Never  . Sexual activity: Not on file  Other Topics Concern  . Not on file  Social History Narrative   Lives at home with husband and dog    Right handed   Caffeine: n/a    Social Determinants of Health   Financial Resource Strain: Not on file  Food Insecurity: Not on file  Transportation Needs: Not on file  Physical Activity: Not on file  Stress: Not on file  Social Connections: Not on file     Family History: The patient's family history includes Heart attack in her mother. ROS:   Please see the history of present illness.    All 14 point review of systems negative except as described per history of present illness  EKGs/Labs/Other Studies Reviewed:      Recent Labs: 03/07/2020: BUN 6; Creatinine, Ser 0.81; Potassium 3.8; Sodium 139  Recent Lipid Panel No results found for: CHOL, TRIG, HDL, CHOLHDL, VLDL, LDLCALC, LDLDIRECT  Physical Exam:    VS:  BP (!) 148/80 (BP Location: Right Arm, Patient Position: Sitting, Cuff Size: Normal)   Pulse 77   Ht 5' 3.5" (1.613 m)   Wt 123 lb 12.8 oz (56.2 kg)   SpO2 94%   BMI 21.59 kg/m     Wt Readings from Last 3 Encounters:  05/07/20 123 lb 12.8 oz (56.2 kg)  04/23/20 124 lb (56.2 kg)  03/07/20 121 lb (54.9 kg)     GEN:  Well nourished, well developed in no acute distress HEENT: Normal NECK: No JVD; No carotid bruits LYMPHATICS: No lymphadenopathy CARDIAC: RRR, no murmurs, no rubs, no gallops RESPIRATORY:  Clear to auscultation without rales, wheezing or rhonchi  ABDOMEN: Soft, non-tender, non-distended MUSCULOSKELETAL:  No edema; No deformity  SKIN: Warm and dry LOWER EXTREMITIES: no swelling NEUROLOGIC:  Alert and oriented x 3 PSYCHIATRIC:  Normal affect   ASSESSMENT:    1. Essential hypertension   2. Chronic migraine without aura, with intractable migraine, so stated, with status migrainosus   3. Lumbar radiculopathy   4. Ischial bursitis of left side   5. Sacroiliitis (HCC)   6. Spondylolisthesis at L5-S1 level   7. Spondylosis of lumbar region without myelopathy or radiculopathy   8. Ingrown toenail   9. Rosacea   10. Renal cyst   11.  Undifferentiated connective tissue disease (HCC)   12. Raynaud's syndrome without gangrene    PLAN:    In order of problems listed above:  1. Essential hypertension plan as outlined above. 2. Chronic migraine: To be followed by neurology. 3. Renal phenomenon: Plan as described above we will try different calcium channel blocker.   Medication Adjustments/Labs and Tests Ordered: Current medicines are reviewed at length with the patient today.  Concerns regarding medicines are outlined above.  No orders  of the defined types were placed in this encounter.  Medication changes: No orders of the defined types were placed in this encounter.   Signed, Georgeanna Lea, MD, [facility] 05/07/2020 2:53 PM    [facility]

## 2020-05-07 NOTE — Addendum Note (Signed)
Addended by: Delorse Limber I on: 05/07/2020 03:02 PM   Modules accepted: Orders

## 2020-05-10 ENCOUNTER — Ambulatory Visit (INDEPENDENT_AMBULATORY_CARE_PROVIDER_SITE_OTHER): Payer: Managed Care, Other (non HMO) | Admitting: Podiatry

## 2020-05-10 ENCOUNTER — Telehealth: Payer: Self-pay | Admitting: Podiatry

## 2020-05-10 ENCOUNTER — Other Ambulatory Visit: Payer: Self-pay

## 2020-05-10 DIAGNOSIS — G8929 Other chronic pain: Secondary | ICD-10-CM

## 2020-05-10 DIAGNOSIS — L600 Ingrowing nail: Secondary | ICD-10-CM

## 2020-05-10 DIAGNOSIS — M79674 Pain in right toe(s): Secondary | ICD-10-CM

## 2020-05-10 MED ORDER — CEPHALEXIN 500 MG PO CAPS: 500.0000 mg | ORAL_CAPSULE | Freq: Three times a day (TID) | ORAL | 0 refills | Status: DC

## 2020-05-10 NOTE — Patient Instructions (Signed)

## 2020-05-10 NOTE — Telephone Encounter (Signed)
sent 

## 2020-05-10 NOTE — Telephone Encounter (Signed)
Patient called back and would like antibiotic sent to her pharmacy please. Please send to [facility]

## 2020-05-15 NOTE — Progress Notes (Signed)
Subjective: 55 year old female presents the office today for concerns of ingrown toenails of the right big toe which is been a chronic issue for her.  The toenail is tender.  Denies any drainage or pus but is getting occasional swelling and redness on the nail borders. Denies any systemic complaints such as fevers, chills, nausea, vomiting. No acute changes since last appointment, and no other complaints at this time.   She is to start a calcium channel blocker for blood pressure but also renounce  Objective: AAO x3, NAD DP/PT pulses palpable bilaterally, CRT less than 3 seconds Incurvation present to both the medial lateral aspects of the right hallux toenail worse than left.  There is localized edema and erythema on the nail folds likely more from inflammation as opposed to infection.  There is no ascending cellulitis.  There is no malodor.  No open lesions. No pain with calf compression, swelling, warmth, erythema  Assessment: Chronic ingrown toenail right hallux  Plan: -All treatment options discussed with the patient including all alternatives, risks, complications.  -At this time, the patient is requesting partial nail removal with chemical matricectomy to the symptomatic portion of the nail.  However during the procedure she decided not to have the nails removed permanently so no phenol was used.  Risks and complications were discussed with the patient for which they understand and written consent was obtained. Under sterile conditions a total of 3 mL of a mixture of 2% lidocaine plain and 0.5% Marcaine plain was infiltrated in a hallux block fashion. Once anesthetized, the skin was prepped in sterile fashion.  Given rainouts I do not use a tourniquet.  Next the medial, lateral aspect of hallux nail border was then sharply excised making sure to remove the entire offending nail border. Once the nails were ensured to be removed area was debrided and the underlying skin was intact. There is no  purulence identified in the procedure. Silvadene was applied. A dry sterile dressing was applied. The patient tolerated the procedure well any complications. Post procedure instructions were discussed the patient for which he verbally understood. Follow-up in one week for nail check or sooner if any problems are to arise. Discussed signs/symptoms of infection and directed to call the office immediately should any occur or go directly to the emergency room. In the meantime, encouraged to call the office with any questions, concerns, changes symptoms. -Keflex  -Patient encouraged to call the office with any questions, concerns, change in symptoms.

## 2020-05-23 ENCOUNTER — Ambulatory Visit (INDEPENDENT_AMBULATORY_CARE_PROVIDER_SITE_OTHER): Payer: Managed Care, Other (non HMO) | Admitting: Neurology

## 2020-05-23 DIAGNOSIS — U099 Post covid-19 condition, unspecified: Secondary | ICD-10-CM

## 2020-05-23 DIAGNOSIS — G4733 Obstructive sleep apnea (adult) (pediatric): Secondary | ICD-10-CM | POA: Diagnosis not present

## 2020-05-23 DIAGNOSIS — R41840 Attention and concentration deficit: Secondary | ICD-10-CM

## 2020-05-23 DIAGNOSIS — G43711 Chronic migraine without aura, intractable, with status migrainosus: Secondary | ICD-10-CM

## 2020-05-23 DIAGNOSIS — G4719 Other hypersomnia: Secondary | ICD-10-CM

## 2020-05-23 DIAGNOSIS — G4709 Other insomnia: Secondary | ICD-10-CM

## 2020-05-25 ENCOUNTER — Encounter: Payer: Self-pay | Admitting: Podiatry

## 2020-05-25 ENCOUNTER — Other Ambulatory Visit: Payer: Self-pay

## 2020-05-25 ENCOUNTER — Ambulatory Visit (INDEPENDENT_AMBULATORY_CARE_PROVIDER_SITE_OTHER): Payer: Managed Care, Other (non HMO) | Admitting: Podiatry

## 2020-05-25 DIAGNOSIS — M79674 Pain in right toe(s): Secondary | ICD-10-CM

## 2020-05-25 DIAGNOSIS — L600 Ingrowing nail: Secondary | ICD-10-CM

## 2020-05-25 DIAGNOSIS — G8929 Other chronic pain: Secondary | ICD-10-CM

## 2020-05-27 NOTE — Progress Notes (Signed)
Subjective: Jennifer Reed is a 55 y.o.  female returns to office today for follow up evaluation after having right Hallux partial nail avulsion performed. Patient has been soaking using epsom salts and applying topical antibiotic covered with bandaid daily.  She does get some discomfort still to the toe but overall is healing.  Denies any drainage or pus pain swelling or redness.  She has pain when she pushes off the toe she reports.  Patient denies fevers, chills, nausea, vomiting. Denies any calf pain, chest pain, SOB.   Objective:   General: Well developed, nourished, in no acute distress, alert and oriented x3   Dermatology: Skin is warm, dry and supple bilateral.  Right hallux nail border appears to be clean, dry, with mild scab. There is no surrounding erythema, edema, drainage/purulence. The remaining nails appear unremarkable at this time. There are no other lesions or other signs of infection present.  Neurovascular status: Intact. No lower extremity swelling; No pain with calf compression bilateral.  Musculoskeletal: Decreased tenderness to palpation of the right hallux nail fold. Muscular strength within normal limits bilateral.   Assesement and Plan: S/p partial nail avulsion, doing well.   -Continue soaking in epsom salts twice a day followed by antibiotic ointment and a band-aid. Can leave uncovered at night. Continue this until completely healed.  -If the area has not healed in 2 weeks, call the office for follow-up appointment, or sooner if any problems arise.  -Dispensed offloading toe.  Continue wearing stiffer soled shoes to avoid pressure to the big toe.  If the pain does not resolve next couple weeks to let me know or sooner there is any worsening. -Monitor for any signs/symptoms of infection. Call the office immediately if any occur or go directly to the emergency room. Call with any questions/concerns.  Ovid Curd, DPM

## 2020-05-30 NOTE — Progress Notes (Signed)
[facility]  HOME SLEEP TEST (Watch PAT)  STUDY DATE: 05/24/20  DOB: May 30, 1965  MRN: [id number]  ORDERING CLINICIAN: Melvyn Novas, MD   REFERRING CLINICIAN: Dr Lucia Gaskins  CLINICAL INFORMATION/HISTORY: Jennifer Reed is a 55 - year- old Caucasian, right handed  female patient  and is seen here in CONSULTATION  Requested by Dr Lucia Gaskins. Seen on 04/23/2020 from Dr Lucia Gaskins.   Chief concern according to patient : see below - Dr Trevor Mace note-     [name]   She has a  has a past medical history of Allergies, Dry eye syndrome,  GERD (gastroesophageal reflux disease), High blood pressure, IBS (irritable bowel syndrome), chronic Migraines, Raynaud's disease, Scleroderma (HCC), and was told she does not have Wolff-Parkinson-White syndrome, only a murmur.she contracted COVID 19 at Thanksgiving 2020, flying to [place] to meet the daughters fiancee. She reports excessive daytime sleepiness since but has longstanding Insomnia, too.   Dr Trevor Mace note- :  Jennifer Reed is a 55 y.o. female here as requested by Adrienne Mocha, PA for migraines.  Past medical history migraine without aura, major depressive disorder recurrent moderate, scleroderma, left renal mass, rosacea, fibromyalgia, anxiety, Raynaud's phenomena, primary insomnia.   I reviewed [name]'s notes: It appears patient is already on Aimovig, sending to [place] here for evaluation of Botox injections for migraines, general examination was normal including nose, ears, eyes, throat, lungs, heart, musculoskeletal, psychiatric and neurologic, no details on patient's migraine disorder was provided which would be helpful when referring to a specialist.  I was able to review Epic notes, patient was last seen at [facility] for Botox injections just a few days ago, unclear why she is being sent here for Botox if she is already seen at a headache clinic in the area. She has also been seen in neurosurgery earlier this month for chronic neck  pain.  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  In addition she has spondylosis of the lumbar region ( NS ) without myelopathy or radiculopathy and has been seen by the neurosurgeons spine center at [facility].  Echo notes going back to at least 2008 , studies were ordered for migraines.  Epworth sleepiness score: 17/24.  BMI: 21.1 kg/m  Neck Circumference:  12"  FINDINGS:   Total Record Time (hours, min): 7 h 42 min Total Sleep Time (hours, min):  6 h 25 min   Percent REM (%):    5.44 %   Calculated pAHI (per hour):  70.2      REM pAHI: N/A    NREM pAHI: N/A   Oxygen Saturation (%) Mean: 93  Minimum oxygen saturation (%):         82   O2 Saturation Range (%): 82-99  O2Saturation (minutes) <=88%: 17.4 min  Pulse Mean (bpm):    93  Pulse Range (41-102)   IMPRESSION: Severe OSA (obstructive sleep apnea with AHI of 70.2/h) was confirmed in this home sleep test,  Minor oxygen desaturation, some snoring and some bradycardia.   RECOMMENDATION: These findings can help explain he headaches better and treatment with CPAP is advised. I suggest using auto CPAP at 6-18 cm water, 3 cm EPR and heated humidification and mask of her choice and comfort.  INTERPRETING PHYSICIAN:  Melvyn Novas, MD  [facility] and [facility] Sleep Board certified by The [employer] of Sleep Medicine and Diplomate of the [employer] of Sleep Medicine. Board certified In Neurology through the ABPN, Fellow of the [employer] of Neurology. Medical Director of [facility].

## 2020-06-04 DIAGNOSIS — U099 Post covid-19 condition, unspecified: Secondary | ICD-10-CM

## 2020-06-04 DIAGNOSIS — G4719 Other hypersomnia: Secondary | ICD-10-CM | POA: Insufficient documentation

## 2020-06-04 DIAGNOSIS — G4733 Obstructive sleep apnea (adult) (pediatric): Secondary | ICD-10-CM

## 2020-06-04 DIAGNOSIS — R41840 Attention and concentration deficit: Secondary | ICD-10-CM | POA: Insufficient documentation

## 2020-06-04 HISTORY — DX: Other hypersomnia: G47.19

## 2020-06-04 HISTORY — DX: Obstructive sleep apnea (adult) (pediatric): G47.33

## 2020-06-04 HISTORY — DX: Post covid-19 condition, unspecified: U09.9

## 2020-06-04 HISTORY — DX: Attention and concentration deficit: R41.840

## 2020-06-04 NOTE — Addendum Note (Signed)
Addended by: Melvyn Novas on: 06/04/2020 04:34 PM   Modules accepted: Orders

## 2020-06-04 NOTE — Progress Notes (Signed)
IMPRESSION: Severe OSA (obstructive sleep apnea with AHI of 70.2/h) was confirmed in this home sleep test,  Minor oxygen desaturation, some snoring and some bradycardia.   RECOMMENDATION: These findings can help explain he headaches better and treatment with CPAP is advised. I suggest using auto CPAP at 6-18 cm water, 3 cm EPR and heated humidification and mask of her choice and comfort.

## 2020-06-04 NOTE — Procedures (Signed)
 [facility]  HOME SLEEP TEST (Watch PAT)  STUDY DATE: 05/24/20  DOB: 07/22/1965  MRN: [id number]  ORDERING CLINICIAN: Blimy Napoleon, MD   REFERRING CLINICIAN: Dr Ahern  CLINICAL INFORMATION/HISTORY: Jennifer Reed is a 55 - year- old Caucasian- year- old Caucasian, right handed  female patient  and is seen here in CONSULTATION  Requested by Dr Ahern. Seen on 04/23/2020 from Dr Ahern.   Chief concern according to patient : see below - Dr Ahern's note-     Jennifer Reed   She has a  has a past medical history of Allergies, Dry eye syndrome,  GERD (gastroesophageal reflux disease), High blood pressure, IBS (irritable bowel syndrome), chronic Migraines, Raynaud's disease, Scleroderma (HCC), and was told she does not have Wolff-Parkinson-White syndrome, only a murmur.she contracted COVID 19 at Thanksgiving 2020, flying to [place] to meet the daughters fiancee. She reports excessive daytime sleepiness since but has longstanding Insomnia, too.   Dr Ahern's note- :  Jennifer Reed is a 55 y.o. female here as requested by Quinn, Kiera A, PA for migraines.  Past medical history migraine without aura, major depressive disorder recurrent moderate, scleroderma, left renal mass, rosacea, fibromyalgia, anxiety, Raynaud's phenomena, primary insomnia.   I reviewed [name]'s notes: It appears patient is already on Aimovig, sending to us here for evaluation of Botox injections for migraines, general examination was normal including nose, ears, eyes, throat, lungs, heart, musculoskeletal, psychiatric and neurologic, no details on patient's migraine disorder was provided which would be helpful when referring to a specialist.  I was able to review Epic notes, patient was last seen at [facility] for Botox injections just a few days ago, unclear why she is being sent here for Botox if she is already seen at a headache clinic in the area. She has also been seen in neurosurgery earlier this month for chronic neck  pain.  She has been a patient at the [facility] for at least a few years and receiving Botox and been seen there for degenerative disc disease in the neck, she has been to [facility] physical therapy for her chronic neck pain .  In addition she has spondylosis of the lumbar region ( NS ) without myelopathy or radiculopathy and has been seen by the neurosurgeons spine center at [facility].  Echo notes going back to at least 2008 , studies were ordered for migraines.  Epworth sleepiness score: 17/24.  BMI: 21.1 kg/m  Neck Circumference:  12"  FINDINGS:   Total Record Time (hours, min): 7 h 42 min Total Sleep Time (hours, min):  6 h 25 min   Percent REM (%):    5.44 %   Calculated pAHI (per hour):  70.2      REM pAHI: N/A    NREM pAHI: N/A   Oxygen Saturation (%) Mean: 93  Minimum oxygen saturation (%):         82   O2 Saturation Range (%): 82-99  O2Saturation (minutes) <=88%: 17.4 min  Pulse Mean (bpm):    93  Pulse Range (41-102)   IMPRESSION: Severe OSA (obstructive sleep apnea with AHI of 70.2/h) was confirmed in this home sleep test,  Minor oxygen desaturation, some snoring and some bradycardia.   RECOMMENDATION: These findings can help explain he headaches better and treatment with CPAP is advised. I suggest using auto CPAP at 6-18 cm water, 3 cm EPR and heated humidification and mask of her choice and comfort.      INTERPRETING PHYSICIAN:  Adisa Vigeant, MD  [facility] Board certified by The American Board of Sleep Medicine and Diplomate of the American Academy of Sleep Medicine. Board certified In Neurology through the ABPN, Fellow of the American Academy of Neurology. Medical Director of [facility].   

## 2020-06-05 ENCOUNTER — Telehealth: Payer: Self-pay | Admitting: Neurology

## 2020-06-05 NOTE — Telephone Encounter (Signed)
I called pt. I advised pt that Dr. Vickey Huger reviewed their sleep study results and found that pt has severe sleep apnea. Dr. Vickey Huger recommends that pt start auto CPAP. I reviewed PAP compliance expectations with the pt. Pt is agreeable to starting a CPAP. I advised pt that an order will be sent to a DME, [facility] and [facility] will call the pt within about one week after they file with the pt's insurance. [facility]) will show the pt how to use the machine, fit for masks, and troubleshoot the CPAP if needed. A follow up appt will need to be made for insurance purposes with Dr. Vickey Huger or the NP. A letter with all of this information in it will be sent to the pt as a reminder. I verified with the pt that the address we have on file is correct. Pt verbalized understanding of results. Pt had no questions at this time but was encouraged to call back if questions arise. I have sent the order to [facility]) and have received confirmation that they have received the order.

## 2020-06-05 NOTE — Telephone Encounter (Signed)
-----   Message from Melvyn Novas, MD sent at 06/04/2020  4:33 PM EST ----- IMPRESSION: Severe OSA (obstructive sleep apnea with AHI of 70.2/h) was confirmed in this home sleep test,  Minor oxygen desaturation, some snoring and some bradycardia.   RECOMMENDATION: These findings can help explain he headaches better and treatment with CPAP is advised. I suggest using auto CPAP at 6-18 cm water, 3 cm EPR and heated humidification and mask of her choice and comfort.

## 2020-06-06 NOTE — Progress Notes (Signed)
I think the planned apnea treatment can make a difference for this headache patient.

## 2020-06-21 ENCOUNTER — Encounter: Payer: Self-pay | Admitting: Neurology

## 2020-06-28 ENCOUNTER — Encounter: Payer: Self-pay | Admitting: Neurology

## 2020-07-09 ENCOUNTER — Telehealth: Payer: Self-pay

## 2020-07-09 DIAGNOSIS — R41840 Attention and concentration deficit: Secondary | ICD-10-CM

## 2020-07-09 DIAGNOSIS — G43919 Migraine, unspecified, intractable, without status migrainosus: Secondary | ICD-10-CM

## 2020-07-09 DIAGNOSIS — U099 Post covid-19 condition, unspecified: Secondary | ICD-10-CM

## 2020-07-09 DIAGNOSIS — M461 Sacroiliitis, not elsewhere classified: Secondary | ICD-10-CM

## 2020-07-09 DIAGNOSIS — Z789 Other specified health status: Secondary | ICD-10-CM

## 2020-07-09 DIAGNOSIS — G4733 Obstructive sleep apnea (adult) (pediatric): Secondary | ICD-10-CM

## 2020-07-09 MED ORDER — AIMOVIG 140 MG/ML ~~LOC~~ SOAJ: 140.0000 mg | SUBCUTANEOUS | 5 refills | Status: DC

## 2020-07-09 NOTE — Telephone Encounter (Signed)
Noted, will wait until HST is repeated and see what results show. Will place referral once HST reviewed

## 2020-07-09 NOTE — Addendum Note (Signed)
Addended by: Melvyn Novas on: 07/09/2020 05:13 PM   Modules accepted: Orders

## 2020-07-09 NOTE — Telephone Encounter (Signed)
Patient came to sleep lab for desensitizing to her cpap machine. She is struggling to wear it. Feels like she cant breathe. [facility] has worked with her on two different mask and she cannot tolerate. I fitted her with a dreamware fullface mask. She still struggled to breathe on cpap of 4. He starts hyperventilating. After 30 minutes working with her, she could not tolerate the cpap. I spoke with Dr. Vickey Huger and we are going to repeat a HST to confirm her AHI. She is a candidate for [facility] and would like a referral. I gave her brochure on [employer] and answered her questions. I will call her after the repeat HST.

## 2020-07-09 NOTE — Telephone Encounter (Signed)
Spoke with Dr Lucia Gaskins and she asked if insurance covered. We would need to send to pharmacy and do PA if required as only samples had been provided so far for trial. Pt reports they help. Will offer 1 Aimovig sample to patient and prescribe to pharmacy.

## 2020-07-11 ENCOUNTER — Other Ambulatory Visit: Payer: Self-pay | Admitting: Neurology

## 2020-07-11 ENCOUNTER — Encounter: Payer: Self-pay | Admitting: Neurology

## 2020-07-11 DIAGNOSIS — G4709 Other insomnia: Secondary | ICD-10-CM

## 2020-07-11 DIAGNOSIS — R519 Headache, unspecified: Secondary | ICD-10-CM

## 2020-07-11 DIAGNOSIS — G4739 Other sleep apnea: Secondary | ICD-10-CM

## 2020-07-12 ENCOUNTER — Telehealth: Payer: Self-pay | Admitting: *Deleted

## 2020-07-12 NOTE — Telephone Encounter (Signed)
Completed Aimovig PA on Cover My Meds. Key: [id number]. Awaiting determination from [employer].

## 2020-07-18 NOTE — Telephone Encounter (Signed)
PA Case: [id number], Status: Approved, Coverage Starts on: 07/18/2020 12:00:00 AM, Coverage Ends on: 01/14/2021 12:00:00 AM.

## 2020-07-24 ENCOUNTER — Ambulatory Visit (INDEPENDENT_AMBULATORY_CARE_PROVIDER_SITE_OTHER): Payer: Managed Care, Other (non HMO) | Admitting: Neurology

## 2020-07-24 ENCOUNTER — Other Ambulatory Visit: Payer: Self-pay

## 2020-07-24 DIAGNOSIS — G4709 Other insomnia: Secondary | ICD-10-CM

## 2020-07-24 DIAGNOSIS — R519 Headache, unspecified: Secondary | ICD-10-CM

## 2020-07-24 DIAGNOSIS — G4710 Hypersomnia, unspecified: Secondary | ICD-10-CM | POA: Diagnosis not present

## 2020-07-24 DIAGNOSIS — G4739 Other sleep apnea: Secondary | ICD-10-CM

## 2020-07-24 DIAGNOSIS — D696 Thrombocytopenia, unspecified: Secondary | ICD-10-CM

## 2020-07-24 DIAGNOSIS — F331 Major depressive disorder, recurrent, moderate: Secondary | ICD-10-CM

## 2020-07-24 DIAGNOSIS — M461 Sacroiliitis, not elsewhere classified: Secondary | ICD-10-CM

## 2020-07-24 DIAGNOSIS — G5600 Carpal tunnel syndrome, unspecified upper limb: Secondary | ICD-10-CM | POA: Insufficient documentation

## 2020-07-24 DIAGNOSIS — H04123 Dry eye syndrome of bilateral lacrimal glands: Secondary | ICD-10-CM | POA: Insufficient documentation

## 2020-08-02 ENCOUNTER — Encounter: Payer: Self-pay | Admitting: Neurology

## 2020-08-02 DIAGNOSIS — R519 Headache, unspecified: Secondary | ICD-10-CM | POA: Insufficient documentation

## 2020-08-02 HISTORY — DX: Headache, unspecified: R51.9

## 2020-08-02 NOTE — Progress Notes (Signed)
Its official - no sleep apnea !  We have contacted the HST company and are investigating how the discrepancy of data came about. Patient does not need CPAP !

## 2020-08-02 NOTE — Procedures (Signed)
PATIENT'S NAME:  Jennifer Reed, Jennifer Reed DOB:      [birth year]      MR#:    [id number]     DATE OF RECORDING: 07/24/2020  Charlesetta Ivory REFERRING M.D.:  Naomie Dean, MD Study Performed:   Baseline Polysomnogram HISTORY:  Jennifer Reed is a 55 - year- old Caucasian, right- handed female patient seen in CONSULTATION requested by Dr Lucia Gaskins. Seen on 04/23/2020 from Dr Lucia Gaskins. She reports excessive daytime sleepiness.  The patient returned after her HST from 06-04-2020 revealed severe OSA, at An AHI of 70.2/h. mild-moderate hypoxia and lack of REM sleep. A repeated HST then returned with an AHI of 0.0/h and to clarify the case she is here for an attended PSG.   Jennifer Reed has a past medical history of Allergies, Dry eye syndrome, GERD (gastroesophageal reflux disease), High blood pressure, IBS (irritable bowel syndrome), chronic Migraines, Raynaud's disease, Scleroderma (HCC), and was told she does not have Wolff-Parkinson-White syndrome, only a murmur. she contracted COVID 19 at Thanksgiving 2020, likely after flying to [place] to meet the daughter's fiance.  She reports excessive daytime sleepiness since but has preexisting longstanding Insomnia, too.     The patient endorsed the Epworth Sleepiness Scale at 17 points.   The patient's weight 121 pounds with a height of 64 (inches), resulting in a BMI of 20.7 kg/m2. The patient's neck circumference measured 12 inches.  CURRENT MEDICATIONS: Xanax, Norvasc, Keflex, Cardizem, Sinequan, Vivelle-Dot, Metrocream, Savella, Botox, Zofran, Lyrica, Nutrec, Maxalt, Zoloft, Topamax, Ubrelvy, Ambien   PROCEDURE:  This is a multichannel digital polysomnogram utilizing the Somnostar 11.2 system.  Electrodes and sensors were applied and monitored per AASM Specifications.   EEG, EOG, Chin and Limb EMG, were sampled at 200 Hz.  ECG, Snore and Nasal Pressure, Thermal Airflow, Respiratory Effort, CPAP Flow and Pressure, Oximetry was sampled at 50 Hz. Digital video and audio were  recorded.      BASELINE STUDY: Lights Out was at 22:04 and Lights On at 05:02.  Total recording time (TRT) was 418 minutes, with a total sleep time (TST) of 230.5 minutes.   The patient's sleep latency was 107 minutes.  REM latency was 0 minutes.  The sleep efficiency was 55.1 %.     SLEEP ARCHITECTURE: WASO (Wake after sleep onset) was 80.5 minutes.  There were 15.5 minutes in Stage N1, 157.5 minutes Stage N2, 57.5 minutes Stage N3 and 0 minutes in Stage REM.  The percentage of Stage N1 was 6.7%, Stage N2 was 68.3%, Stage N3 was 24.9% and Stage R (REM sleep) was 0%.   RESPIRATORY ANALYSIS:  There were a total of 0 respiratory events:  0 obstructive apneas, 0 central apneas and 0 mixed apneas with a total of 0 apneas and an apnea index (AI) of 0 /hour. There were 0 hypopneas with a hypopnea index of 0 /hour. The patient also had 0 respiratory event related arousals (RERAs).     The total APNEA/HYPOPNEA INDEX (AHI) was 0/hour and the total RESPIRATORY DISTURBANCE INDEX was  0 /hour.  0 events occurred in REM sleep and 0 events in NREM. The REM AHI was  0 /hour, versus a non-REM AHI of 0. The patient spent 61.5 minutes of total sleep time in the supine position and 169 minutes in non-supine. The supine AHI was 0.0 versus a non-supine AHI of 0.0.  OXYGEN SATURATION & C02:  The Wake baseline 02 saturation was 96%, with the lowest being 93%. Time spent below 89% saturation equaled 0  minutes.  The patient had a total of 0 Periodic Limb Movements.    Audio and video analysis did not show any abnormal or unusual movements, behaviors, phonations or vocalizations.   The patient took bathroom breaks. No Snoring was noted. EKG was in keeping with normal sinus rhythm (NSR).   IMPRESSION: Insomnia is noted, the sleep efficiency was only 55 %   1. This PSG confirmed that there is no Obstructive Sleep Apnea (OSA) 2. No Periodic Limb Movement Disorder (PLMD) 3. No Primary Snoring and normal  EKG   RECOMMENDATIONS: The patient does not need CPAP or any other intervention.  !   I certify that I have reviewed the entire raw data recording prior to the issuance of this report in accordance with the Standards of Accreditation of the American Academy of Sleep Medicine (AASM)    Melvyn Novas, MD Diplomat, American Board of Psychiatry and Neurology  Diplomat, American Board of Sleep Medicine Wellsite geologist, [facility] [employer]

## 2020-08-06 ENCOUNTER — Ambulatory Visit: Payer: Managed Care, Other (non HMO) | Admitting: Neurology

## 2020-09-05 ENCOUNTER — Other Ambulatory Visit: Payer: Self-pay

## 2020-09-05 DIAGNOSIS — G43909 Migraine, unspecified, not intractable, without status migrainosus: Secondary | ICD-10-CM | POA: Insufficient documentation

## 2020-09-05 DIAGNOSIS — K219 Gastro-esophageal reflux disease without esophagitis: Secondary | ICD-10-CM | POA: Insufficient documentation

## 2020-09-05 DIAGNOSIS — H04129 Dry eye syndrome of unspecified lacrimal gland: Secondary | ICD-10-CM | POA: Insufficient documentation

## 2020-09-05 DIAGNOSIS — T7840XA Allergy, unspecified, initial encounter: Secondary | ICD-10-CM | POA: Insufficient documentation

## 2020-09-05 DIAGNOSIS — I10 Essential (primary) hypertension: Secondary | ICD-10-CM | POA: Insufficient documentation

## 2020-09-05 DIAGNOSIS — K589 Irritable bowel syndrome without diarrhea: Secondary | ICD-10-CM | POA: Insufficient documentation

## 2020-09-05 DIAGNOSIS — I456 Pre-excitation syndrome: Secondary | ICD-10-CM | POA: Insufficient documentation

## 2020-09-12 ENCOUNTER — Ambulatory Visit: Payer: Managed Care, Other (non HMO) | Admitting: Cardiology

## 2020-09-12 ENCOUNTER — Encounter: Payer: Self-pay | Admitting: Cardiology

## 2020-09-12 ENCOUNTER — Other Ambulatory Visit: Payer: Self-pay

## 2020-09-12 VITALS — BP 146/78 | HR 90 | Ht 63.0 in | Wt 123.0 lb

## 2020-09-12 DIAGNOSIS — Z79899 Other long term (current) drug therapy: Secondary | ICD-10-CM

## 2020-09-12 DIAGNOSIS — I10 Essential (primary) hypertension: Secondary | ICD-10-CM | POA: Diagnosis not present

## 2020-09-12 DIAGNOSIS — F419 Anxiety disorder, unspecified: Secondary | ICD-10-CM | POA: Diagnosis not present

## 2020-09-12 DIAGNOSIS — I7300 Raynaud's syndrome without gangrene: Secondary | ICD-10-CM | POA: Diagnosis not present

## 2020-09-12 MED ORDER — LOSARTAN POTASSIUM 25 MG PO TABS: 25.0000 mg | ORAL_TABLET | Freq: Every day | ORAL | 1 refills | Status: DC

## 2020-09-12 NOTE — Progress Notes (Signed)
Cardiology Office Note:    Date:  09/12/2020   ID:  Jennifer Reed, DOB Dec 30, 1965, MRN [id number]  PCP:  Adrienne Mocha, PA  Cardiologist:  Gypsy Balsam, MD    Referring MD: Adrienne Mocha, PA   Chief Complaint  Patient presents with  . Hypertension    History of Present Illness:    Jennifer Reed is a 55 y.o. female with past medical history significant for essential hypertension, Raynaud's phenomenon, questionable scleroderma, we been struggling lately to control her blood pressure.  I tried Norvasc which she did not tolerate that we tried Cardizem CD she also had difficulty tolerating it looks like he says she is not able to tolerate calcium channel blocker I will try losartan 25 mg with intention to check her kidney function within next few days.  We did talk about relaxation technique what to do to bring her blood pressure down without medications.  She is willing to try medication she is very anxious her daughter apparently getting married in May and she is very tense about it.  She also described the fact that she had difficulty sleeping.  Past Medical History:  Diagnosis Date  . Acute confusional migraine, refractory 07/21/2014  . Allergic rhinitis 10/16/2001   Rhinitis Allergic  NOS Rhinitis Allergic  NOS  Formatting of this note might be different from the original. Overview:  Rhinitis Allergic  NOS  . Allergies   . Anxiety 05/07/2020  . Chronic migraine without aura, with intractable migraine, so stated, with status migrainosus 03/07/2020  . Cognitive complaints 05/14/2018  . COVID-19 long hauler manifesting chronic concentration deficit 06/04/2020  . Dry eye syndrome   . Encounter for screening for cardiovascular disorders 03/11/2009  . Essential hypertension 10/08/2006   (Problem list name updated by automated process. Provider to review and confirm.) (Problem list name updated by automated process. Provider to review and confirm.)  Formatting of this note might be different from  the original. Overview:  (Problem list name updated by automated process. Provider to review and confirm.)  . Excessive daytime sleepiness 06/04/2020  . Fibromyalgia   . Gastroesophageal reflux disease without esophagitis 04/13/2018  . GERD (gastroesophageal reflux disease)   . Headache 04/28/2013   Problem list name updated by automated process. Provider to review Problem list name updated by automated process. Provider to review  Formatting of this note might be different from the original. Overview:  Problem list name updated by automated process. Provider to review  . Hemorrhoids 10/13/2018  . High blood pressure   . History of colon polyps 10/13/2018  . IBS (irritable bowel syndrome)   . Ingrown toenail 01/20/2020  . Intractable migraine 04/28/2013   Formatting of this note might be different from the original. Overview:  Problem list name updated by automated process. Provider to review  Formatting of this note might be different from the original. Overview:  updating diagnosis code for icd10 cutover  . Ischial bursitis of left side 07/01/2017   Added automatically from request for surgery [id number]  . Low back pain 04/28/2016  . Lumbar radiculopathy 04/28/2016  . Migraine 10/16/2001   Problem list name updated by automated process. Provider to review Problem list name updated by automated process. Provider to review  updating diagnosis code for icd10 cutover updating diagnosis code for icd10 cutover  updating diagnosis code for icd10 cutover updating diagnosis code for icd10 cutover  Migraine Without Aura Migraine Without Aura  Formatting of this note might be different from th  .  Migraine without aura, not refractory 07/29/2013   Formatting of this note might be different from the original. Overview:  updating diagnosis code for icd10 cutover  . Migraines   . Mild major depression (HCC) 10/16/2009  . 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  . Morning headache 08/02/2020  . Raynaud's  disease   . Raynaud's phenomenon 10/08/2006   (Problem list name updated by automated process. Provider to review and confirm.) (Problem list name updated by automated process. Provider to review and confirm.)  . Raynaud's syndrome without gangrene 10/08/2006   Formatting of this note might be different from the original. Overview:  (Problem list name updated by automated process. Provider to review and confirm.)  . Reactive airway disease 04/13/2018  . Renal cyst 08/18/2017  . Rosacea 05/07/2020  . Sacroiliitis (HCC) 06/05/2017   Added automatically from request for surgery [phone]  . 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 Scleroderma (HCC)   . Severe obstructive sleep apnea-hypopnea syndrome 06/04/2020  . Spondylolisthesis at L5-S1 level 11/29/2012  . Spondylosis of lumbar region without myelopathy or radiculopathy 07/17/2017   Added automatically from request for surgery [phone]  . Thrombocytopenia (HCC) 04/17/2017  . 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  . Wolff-Parkinson-White syndrome     Past Surgical History:  Procedure Laterality Date  . BACK SURGERY     lumbar fusion  . CESAREAN SECTION  1999 and 1998  . NECK SURGERY    . PARTIAL HYSTERECTOMY      Current Medications: Current Meds  Medication Sig  . ALPRAZolam (XANAX) 1 MG tablet Take 0.5 mg by mouth as needed for anxiety.  . Botulinum Toxin Type A (BOTOX) 200 units SOLR Inject 200 Units into the skin See admin instructions. Every 90 days  . diltiazem (CARDIZEM) 30 MG tablet Take 30 mg by mouth daily.  Marland Kitchen doxepin (SINEQUAN) 10 MG capsule Take 20 mg by mouth at bedtime.  Dorise Hiss (AIMOVIG) 140 MG/ML SOAJ Inject 140 mg into the skin every 30 (thirty) days.  Marland Kitchen losartan (COZAAR) 25 MG tablet Take 1 tablet (25 mg total) by mouth daily.  . metroNIDAZOLE (METROCREAM) 0.75 % cream Apply 1 application topically 2 (two) times daily.  . ondansetron (ZOFRAN-ODT) 4 MG disintegrating tablet Take 1-2 tablets (4-8 mg  total) by mouth every 8 (eight) hours as needed for nausea.  . pregabalin (LYRICA) 150 MG capsule Take 150 mg by mouth 3 (three) times daily.  . Rimegepant Sulfate (NURTEC) 75 MG TBDP Take 75 mg by mouth every other day. For migraines.  . rizatriptan (MAXALT) 10 MG tablet Take 10 mg by mouth as needed for migraine.  Marland Kitchen SAVELLA 50 MG TABS tablet Take 50 mg by mouth 2 (two) times daily.  . sertraline (ZOLOFT) 100 MG tablet Take 100 mg by mouth daily.  Marland Kitchen topiramate (TOPAMAX) 100 MG tablet Take 100 mg by mouth 2 (two) times daily.  Marland Kitchen tretinoin (RETIN-A) 0.05 % cream Apply 1 application topically at bedtime.  Marland Kitchen UBRELVY 100 MG TABS Take 100 mg by mouth daily.  Marland Kitchen zolpidem (AMBIEN CR) 12.5 MG CR tablet Take 12.5 mg by mouth at bedtime as needed for sleep.     Allergies:   Codeine, Gluten meal, Lac bovis, Albuterol, Amlodipine besylate, and Milk-related compounds   Social History   Socioeconomic History  . Marital status: Married    Spouse name: Not on file  . Number of children: 2  . Years of education: Not on file  . Highest education level: Not  on file  Occupational History  . Not on file  Tobacco Use  . Smoking status: Never Smoker  . Smokeless tobacco: Never Used  Vaping Use  . Vaping Use: Never used  Substance and Sexual Activity  . Alcohol use: Yes    Comment: social drinks rarely ("like a cocktail a month")  . Drug use: Never  . Sexual activity: Not on file  Other Topics Concern  . Not on file  Social History Narrative   Lives at home with husband and dog   Right handed   Caffeine: n/a    Social Determinants of Health   Financial Resource Strain: Not on file  Food Insecurity: Not on file  Transportation Needs: Not on file  Physical Activity: Not on file  Stress: Not on file  Social Connections: Not on file     Family History: The patient's family history includes Heart attack in her mother. ROS:   Please see the history of present illness.    All 14 point review  of systems negative except as described per history of present illness  EKGs/Labs/Other Studies Reviewed:      Recent Labs: 03/07/2020: BUN 6; Creatinine, Ser 0.81; Potassium 3.8; Sodium 139  Recent Lipid Panel No results found for: CHOL, TRIG, HDL, CHOLHDL, VLDL, LDLCALC, LDLDIRECT  Physical Exam:    VS:  BP (!) 146/78 (BP Location: Right Arm, Patient Position: Sitting)   Pulse 90   Ht 5\' 3"  (1.6 m)   Wt 123 lb (55.8 kg)   SpO2 98%   BMI 21.79 kg/m     Wt Readings from Last 3 Encounters:  09/12/20 123 lb (55.8 kg)  05/07/20 123 lb 12.8 oz (56.2 kg)  04/23/20 124 lb (56.2 kg)     GEN:  Well nourished, well developed in no acute distress HEENT: Normal NECK: No JVD; No carotid bruits LYMPHATICS: No lymphadenopathy CARDIAC: RRR, no murmurs, no rubs, no gallops RESPIRATORY:  Clear to auscultation without rales, wheezing or rhonchi  ABDOMEN: Soft, non-tender, non-distended MUSCULOSKELETAL:  No edema; No deformity  SKIN: Warm and dry LOWER EXTREMITIES: no swelling NEUROLOGIC:  Alert and oriented x 3 PSYCHIATRIC:  Normal affect   ASSESSMENT:    1. Medication management   2. Primary hypertension   3. Raynaud's phenomenon without gangrene   4. Anxiety    PLAN:    In order of problems listed above:  1. Essential hypertension we will start losartan 25 we will check Chem-7 next week. 2. Raynaud phenomenon she does have some connective tissue disorder which she is specified yet.  This is probably responsible for her phenomenon.  With calcium channel blocker that was better but she cannot tolerate calcium channel blocker which make her weak tired and exhausted.  Therefore we will try ARB to help with the blood pressure.  With understanding that her renal phenomena may get little bit worse. 3. Anxiety I think significantly contributing factor.  Followed by antimedicine team.   Medication Adjustments/Labs and Tests Ordered: Current medicines are reviewed at length with the  patient today.  Concerns regarding medicines are outlined above.  Orders Placed This Encounter  Procedures  . Basic metabolic panel   Medication changes:  Meds ordered this encounter  Medications  . losartan (COZAAR) 25 MG tablet    Sig: Take 1 tablet (25 mg total) by mouth daily.    Dispense:  90 tablet    Refill:  1    Signed, 04/25/20, MD, [facility] 09/12/2020 1:29 PM  [facility]

## 2020-09-12 NOTE — Patient Instructions (Signed)
Medication Instructions:  Your physician has recommended you make the following change in your medication:   START: Losartan 25 mg daily  *If you need a refill on your cardiac medications before your next appointment, please call your pharmacy*   Lab Work: Your physician recommends that you return for lab work in 1 week: BMP  If you have labs (blood work) drawn today and your tests are completely normal, you will receive your results only by: Marland Kitchen MyChart Message (if you have MyChart) OR . A paper copy in the mail If you have any lab test that is abnormal or we need to change your treatment, we will call you to review the results.   Testing/Procedures: None   Follow-Up: At [facility], you and your health needs are our priority.  As part of our continuing mission to provide you with exceptional heart care, we have created designated Provider Care Teams.  These Care Teams include your primary Cardiologist (physician) and Advanced Practice Providers (APPs -  Physician Assistants and Nurse Practitioners) who all work together to provide you with the care you need, when you need it.  We recommend signing up for the patient portal called "MyChart".  Sign up information is provided on this After Visit Summary.  MyChart is used to connect with patients for Virtual Visits (Telemedicine).  Patients are able to view lab/test results, encounter notes, upcoming appointments, etc.  Non-urgent messages can be sent to your provider as well.   To learn more about what you can do with MyChart, go to [email or web address].    Your next appointment:   3 month(s)  The format for your next appointment:   In Person  Provider:   Gypsy Balsam, MD   Other Instructions  Losartan Tablets What is this medicine? LOSARTAN (loe SAR tan) is an angiotensin II receptor blocker, also known as an ARB. It treats high blood pressure. It can slow kidney damage in some patients. It may also be used to lower the  risk of stroke. This medicine may be used for other purposes; ask your health care provider or pharmacist if you have questions. COMMON BRAND NAME(S): Cozaar What should I tell my health care provider before I take this medicine? They need to know if you have any of these conditions:  heart failure  kidney disease  liver disease  an unusual or allergic reaction to losartan, other medicines, foods, dyes, or preservatives  pregnant or trying to get pregnant  breast-feeding How should I use this medicine? Take this medicine by mouth. Take it as directed on the prescription label at the same time every day. You can take it with or without food. If it upsets your stomach, take it with food. Keep taking it unless your health care provider tells you to stop. Talk to your health care provider about the use of this medicine in children. While it may be prescribed for children as young as 6 for selected conditions, precautions do apply. Overdosage: If you think you have taken too much of this medicine contact a poison control center or emergency room at once. NOTE: This medicine is only for you. Do not share this medicine with others. What if I miss a dose? If you miss a dose, take it as soon as you can. If it is almost time for your next dose, take only that dose. Do not take double or extra doses. What may interact with this medicine?  aliskiren  ACE inhibitors, like enalapril or lisinopril  diuretics, especially amiloride, eplerenone, spironolactone, or triamterene  lithium  NSAIDs, medicines for pain and inflammation, like ibuprofen or naproxen  potassium salts or potassium supplements This list may not describe all possible interactions. Give your health care provider a list of all the medicines, herbs, non-prescription drugs, or dietary supplements you use. Also tell them if you smoke, drink alcohol, or use illegal drugs. Some items may interact with your medicine. What should I  watch for while using this medicine? Visit your health care provider for regular check ups. Check your blood pressure as directed. Ask your health care provider what your blood pressure should be. Also, find out when you should contact him or her. Do not treat yourself for coughs, colds, or pain while you are using this medicine without asking your health care provider for advice. Some medicines may increase your blood pressure. Women should inform their health care provider if they wish to become pregnant or think they might be pregnant. There is a potential for serious side effects to an unborn child. Talk to your health care provider for more information. You may get drowsy or dizzy. Do not drive, use machinery, or do anything that needs mental alertness until you know how this medicine affects you. Do not stand or sit up quickly, especially if you are an older patient. This reduces the risk of dizzy or fainting spells. Alcohol can make you more drowsy and dizzy. Avoid alcoholic drinks. Avoid salt substitutes unless you are told otherwise by your health care provider. What side effects may I notice from receiving this medicine? Side effects that you should report to your doctor or health care professional as soon as possible:  allergic reactions (skin rash, itching or hives, swelling of the hands, feet, face, lips, throat, or tongue)  breathing problems  high potassium levels (chest pain; or fast, irregular heartbeat; muscle weakness)  kidney injury (trouble passing urine or change in the amount of urine)  low blood pressure (dizziness; feeling faint or lightheaded, falls; unusually weak or tired) Side effects that usually do not require medical attention (report to your doctor or health care professional if they continue or are bothersome):  cough  headache  nasal congestion or stuffiness  nausea or stomach pain This list may not describe all possible side effects. Call your doctor for  medical advice about side effects. You may report side effects to FDA at 1-800-FDA-1088. Where should I keep my medicine? Keep out of the reach of children and pets. Store at room temperature between 20 and 25 degrees C (68 and 77 degrees F). Protect from light. Keep the container tightly closed. Get rid of any unused medicine after the expiration date. To get rid of medicines that are no longer needed or have expired:  Take the medicine to a medicine take-back program. Check with your pharmacy or law enforcement to find a location.  If you cannot return the medicine, check the label or package insert to see if the medicine should be thrown out in the garbage or flushed down the toilet. If you are not sure, ask your health care provider. If it is safe to put in the trash, empty the medicine out of the container. Mix the medicine with cat litter, dirt, coffee grounds, or other unwanted substance. Seal the mixture in a bag or container. Put it in the trash. NOTE: This sheet is a summary. It may not cover all possible information. If you have questions about this medicine, talk to your doctor,  pharmacist, or health care provider.  2021 Elsevier/Gold Standard (2019-07-07 16:16:09)

## 2020-10-06 LAB — BASIC METABOLIC PANEL
BUN/Creatinine Ratio: 9 (ref 9–23)
BUN: 7 mg/dL (ref 6–24)
CO2: 22 mmol/L (ref 20–29)
Calcium: 9.2 mg/dL (ref 8.7–10.2)
Chloride: 106 mmol/L (ref 96–106)
Creatinine, Ser: 0.81 mg/dL (ref 0.57–1.00)
Glucose: 88 mg/dL (ref 65–99)
Potassium: 3.4 mmol/L — ABNORMAL LOW (ref 3.5–5.2)
Sodium: 141 mmol/L (ref 134–144)
eGFR: 87 mL/min/{1.73_m2} (ref >59)

## 2020-10-09 ENCOUNTER — Telehealth: Payer: Self-pay | Admitting: Emergency Medicine

## 2020-10-09 DIAGNOSIS — Z79899 Other long term (current) drug therapy: Secondary | ICD-10-CM

## 2020-10-09 NOTE — Telephone Encounter (Signed)
Called and spoke to patient. Jennifer Reed reports Jennifer Reed has not been checking blood pressure as frequently as Jennifer Reed should May 10th is her last reading 122/87, and then today 134/91. Jennifer Reed does feel a little better. Will check with Dr. Bing Matter if he still wants to increase losartan or not.

## 2020-10-09 NOTE — Telephone Encounter (Signed)
-----   Message from Georgeanna Lea, MD sent at 10/09/2020  3:10 PM EDT ----- Interesting,  Potassium was held on the lower side.  Should be elevated higher with losartan, please call her and ask her what the blood pressure is in blockers more than 140/90 please increase losartan from 25 to 50 mg daily, Chem-7 need to be repeated in about a week

## 2020-10-11 NOTE — Telephone Encounter (Signed)
Called patient informed her to increase losartan to 50 mg daily per Dr. Bing Matter. She understood and will have labs repeated next week. No further questions.

## 2020-10-11 NOTE — Addendum Note (Signed)
Addended by: Hazle Quant on: 10/11/2020 08:31 AM   Modules accepted: Orders

## 2020-10-30 LAB — BASIC METABOLIC PANEL
BUN/Creatinine Ratio: 16 (ref 9–23)
BUN: 13 mg/dL (ref 6–24)
CO2: 23 mmol/L (ref 20–29)
Calcium: 9.2 mg/dL (ref 8.7–10.2)
Chloride: 106 mmol/L (ref 96–106)
Creatinine, Ser: 0.8 mg/dL (ref 0.57–1.00)
Glucose: 103 mg/dL — ABNORMAL HIGH (ref 65–99)
Potassium: 3.6 mmol/L (ref 3.5–5.2)
Sodium: 140 mmol/L (ref 134–144)
eGFR: 88 mL/min/{1.73_m2} (ref >59)

## 2020-11-20 ENCOUNTER — Telehealth: Payer: Self-pay | Admitting: *Deleted

## 2020-11-20 ENCOUNTER — Telehealth: Payer: Self-pay | Admitting: Neurology

## 2020-11-20 NOTE — Telephone Encounter (Signed)
Completed Nurtec PA on Cover My Meds. Key: [id number]. Awaiting determination from [employer].

## 2020-11-20 NOTE — Telephone Encounter (Signed)
I called pt.  She is asking about getting BOTOX here now vs getting at [facility].  Her last BOTOX will be 11-21-20.  I made first available 02-19-21 for OV to discuss meds.  Can we start process to change?  Also mentioned some memory loss.  I relayed that she needs to see pcp for iniital eval. Then if needs neuro be glad to see her.

## 2020-11-20 NOTE — Telephone Encounter (Signed)
Pt requesting a call back, wants to know if there are any test to detect preset dementia, also wants to start having Botox done here for her headaches. Please advise.

## 2020-11-22 NOTE — Telephone Encounter (Signed)
Pt had not read the [employer].  I relayed that due to staffing challenges at this time recommended would stay with her neurologist performing the botox at this time.  Pt ok to do this.  She feels like Dr. Lucia Gaskins got it/her after speaking to her at appt and appreciated this and is getting medication from [place]. Pt will send mychart message to Dr. Lucia Gaskins.  I did not erase appt 02/2021 at this time.

## 2020-11-27 NOTE — Telephone Encounter (Signed)
Received denial for Nurtec due to medical necessity, not for chronic migraines. But FDA approved for acute treatment of migraine and episodic migraine.  [employer].  ID # [id number].

## 2020-11-28 NOTE — Telephone Encounter (Signed)
Noted  

## 2020-12-03 NOTE — Telephone Encounter (Signed)
Resubmitted again to [facility] [id number] for Nurtec 75mg  tabs #16/ 30 days.  Acute Migraine G43.909.

## 2020-12-03 NOTE — Telephone Encounter (Signed)
Acute migraines G43.909 added to pt profile

## 2020-12-04 NOTE — Telephone Encounter (Signed)
Received approval for Nurtec ODT 12-03-20 ro 12-03-21. Fax confirmation received [facility].

## 2020-12-10 DIAGNOSIS — R140 Abdominal distension (gaseous): Secondary | ICD-10-CM | POA: Insufficient documentation

## 2020-12-10 DIAGNOSIS — K52839 Microscopic colitis, unspecified: Secondary | ICD-10-CM | POA: Insufficient documentation

## 2020-12-14 ENCOUNTER — Encounter (HOSPITAL_BASED_OUTPATIENT_CLINIC_OR_DEPARTMENT_OTHER): Payer: Self-pay | Admitting: Emergency Medicine

## 2020-12-14 ENCOUNTER — Other Ambulatory Visit: Payer: Self-pay

## 2020-12-14 ENCOUNTER — Emergency Department (HOSPITAL_BASED_OUTPATIENT_CLINIC_OR_DEPARTMENT_OTHER)
Admission: EM | Admit: 2020-12-14 | Discharge: 2020-12-14 | Disposition: A | Discharge status: Home / Self Care | Payer: Managed Care, Other (non HMO) | Attending: Emergency Medicine | Admitting: Emergency Medicine

## 2020-12-14 ENCOUNTER — Emergency Department (HOSPITAL_BASED_OUTPATIENT_CLINIC_OR_DEPARTMENT_OTHER): Payer: Managed Care, Other (non HMO)

## 2020-12-14 DIAGNOSIS — Z8616 Personal history of COVID-19: Secondary | ICD-10-CM | POA: Insufficient documentation

## 2020-12-14 DIAGNOSIS — I10 Essential (primary) hypertension: Secondary | ICD-10-CM | POA: Diagnosis not present

## 2020-12-14 DIAGNOSIS — R1010 Upper abdominal pain, unspecified: Secondary | ICD-10-CM | POA: Insufficient documentation

## 2020-12-14 DIAGNOSIS — R5383 Other fatigue: Secondary | ICD-10-CM | POA: Diagnosis present

## 2020-12-14 DIAGNOSIS — E876 Hypokalemia: Secondary | ICD-10-CM

## 2020-12-14 DIAGNOSIS — E860 Dehydration: Secondary | ICD-10-CM | POA: Diagnosis not present

## 2020-12-14 DIAGNOSIS — Z79899 Other long term (current) drug therapy: Secondary | ICD-10-CM | POA: Diagnosis not present

## 2020-12-14 LAB — COMPREHENSIVE METABOLIC PANEL
ALT: 14 U/L (ref 0–44)
AST: 19 U/L (ref 15–41)
Albumin: 4.9 g/dL (ref 3.5–5.0)
Alkaline Phosphatase: 86 U/L (ref 38–126)
Anion gap: 9 (ref 5–15)
BUN: 7 mg/dL (ref 6–20)
CO2: 26 mmol/L (ref 22–32)
Calcium: 9.3 mg/dL (ref 8.9–10.3)
Chloride: 103 mmol/L (ref 98–111)
Creatinine, Ser: 0.8 mg/dL (ref 0.44–1.00)
GFR, Estimated: >60 mL/min (ref >60)
Glucose, Bld: 88 mg/dL (ref 70–99)
Potassium: 2.9 mmol/L — ABNORMAL LOW (ref 3.5–5.1)
Sodium: 138 mmol/L (ref 135–145)
Total Bilirubin: 0.5 mg/dL (ref 0.3–1.2)
Total Protein: 8.4 g/dL — ABNORMAL HIGH (ref 6.5–8.1)

## 2020-12-14 LAB — CBC WITH DIFFERENTIAL/PLATELET
Abs Immature Granulocytes: 0.01 10*3/uL (ref 0.00–0.07)
Basophils Absolute: 0 10*3/uL (ref 0.0–0.1)
Basophils Relative: 1 %
Eosinophils Absolute: 0 10*3/uL (ref 0.0–0.5)
Eosinophils Relative: 1 %
HCT: 51.1 % — ABNORMAL HIGH (ref 36.0–46.0)
Hemoglobin: 17.4 g/dL — ABNORMAL HIGH (ref 12.0–15.0)
Immature Granulocytes: 0 %
Lymphocytes Relative: 38 %
Lymphs Abs: 1.5 10*3/uL (ref 0.7–4.0)
MCH: 31.7 pg (ref 26.0–34.0)
MCHC: 34.1 g/dL (ref 30.0–36.0)
MCV: 93.1 fL (ref 80.0–100.0)
Monocytes Absolute: 0.4 10*3/uL (ref 0.1–1.0)
Monocytes Relative: 10 %
Neutro Abs: 2 10*3/uL (ref 1.7–7.7)
Neutrophils Relative %: 50 %
Platelets: 186 10*3/uL (ref 150–400)
RBC: 5.49 MIL/uL — ABNORMAL HIGH (ref 3.87–5.11)
RDW: 13 % (ref 11.5–15.5)
WBC: 3.9 10*3/uL — ABNORMAL LOW (ref 4.0–10.5)
nRBC: 0 % (ref 0.0–0.2)

## 2020-12-14 LAB — URINALYSIS, ROUTINE W REFLEX MICROSCOPIC
Bilirubin Urine: NEGATIVE
Glucose, UA: NEGATIVE mg/dL
Hgb urine dipstick: NEGATIVE
Ketones, ur: NEGATIVE mg/dL
Leukocytes,Ua: NEGATIVE
Nitrite: NEGATIVE
Protein, ur: NEGATIVE mg/dL
Specific Gravity, Urine: 1.02 (ref 1.005–1.030)
pH: 6 (ref 5.0–8.0)

## 2020-12-14 LAB — LIPASE, BLOOD: Lipase: 41 U/L (ref 11–51)

## 2020-12-14 LAB — LACTIC ACID, PLASMA: Lactic Acid, Venous: 0.7 mmol/L (ref 0.5–1.9)

## 2020-12-14 MED ORDER — KCL-LACTATED RINGERS 20 MEQ/L IV SOLN: INTRAVENOUS | Status: DC

## 2020-12-14 MED ORDER — POTASSIUM CHLORIDE 10 MEQ/100ML IV SOLN
10.0000 meq | INTRAVENOUS | Status: DC
  Administered 2020-12-14: 10 meq via INTRAVENOUS
  Filled 2020-12-14 (×2): qty 100

## 2020-12-14 MED ORDER — LACTATED RINGERS IV BOLUS
1500.0000 mL | Freq: Once | INTRAVENOUS | Status: AC
  Administered 2020-12-14: 1000 mL via INTRAVENOUS

## 2020-12-14 MED ORDER — ONDANSETRON 4 MG PO TBDP: 4.0000 mg | ORAL_TABLET | ORAL | 0 refills | Status: DC | PRN

## 2020-12-14 MED ORDER — POTASSIUM CHLORIDE 10 MEQ/100ML IV SOLN: 10.0000 meq | Freq: Once | INTRAVENOUS | Status: DC

## 2020-12-14 MED ORDER — POTASSIUM CHLORIDE CRYS ER 20 MEQ PO TBCR: 20.0000 meq | EXTENDED_RELEASE_TABLET | Freq: Two times a day (BID) | ORAL | 0 refills | Status: DC

## 2020-12-14 MED ORDER — POTASSIUM CHLORIDE CRYS ER 20 MEQ PO TBCR
40.0000 meq | EXTENDED_RELEASE_TABLET | Freq: Once | ORAL | Status: AC
  Administered 2020-12-14: 40 meq via ORAL
  Filled 2020-12-14: qty 2

## 2020-12-14 NOTE — ED Provider Notes (Signed)
[facility] EMERGENCY DEPARTMENT Provider Note   CSN: [id number] Arrival date & time: 12/14/20  1435     History Chief Complaint  Patient presents with   Headache   Abdominal Pain    Jennifer Reed is a 55 y.o. female.  HPI Patient had upper and lower endoscopy yesterday.  She reports she did about a 5-day prep for this procedure.  She reports as of today she still feels very fatigued and generally weak.  She reports she does not feel very good.  She does not have a lot of pain.  She reports she has some discomfort because she had an esophageal dilation but does not think it is more painful than she would anticipate.  She has been able to swallow her medications and liquids.  She reports however she really has no appetite and has not been able to eat or drink much since the procedure.  However, she is not having difficulty handling secretions or taking liquids to take medications.  She does not have significant abdominal pain.  She reports she has urinated a couple of times but is not really passing much gas.  She does feel like her upper abdomen is bloated.  No fevers.  Patient does have history of scleroderma and Raynaud's.  She reports she did take her a.m. medications but is due for her losartan.    Past Medical History:  Diagnosis Date   Acute confusional migraine, refractory 07/21/2014   Allergic rhinitis 10/16/2001   Rhinitis Allergic  NOS Rhinitis Allergic  NOS  Formatting of this note might be different from the original. Overview:  Rhinitis Allergic  NOS   Allergies    Anxiety 05/07/2020   Chronic migraine without aura, with intractable migraine, so stated, with status migrainosus 03/07/2020   Cognitive complaints 05/14/2018   COVID-19 long hauler manifesting chronic concentration deficit 06/04/2020   Dry eye syndrome    Encounter for screening for cardiovascular disorders 03/11/2009   Essential hypertension 10/08/2006   (Problem list name updated by automated process.  Provider to review and confirm.) (Problem list name updated by automated process. Provider to review and confirm.)  Formatting of this note might be different from the original. Overview:  (Problem list name updated by automated process. Provider to review and confirm.)   Excessive daytime sleepiness 06/04/2020   Fibromyalgia    Gastroesophageal reflux disease without esophagitis 04/13/2018   GERD (gastroesophageal reflux disease)    Headache 04/28/2013   Problem list name updated by automated process. Provider to review Problem list name updated by automated process. Provider to review  Formatting of this note might be different from the original. Overview:  Problem list name updated by automated process. Provider to review   Hemorrhoids 10/13/2018   High blood pressure    History of colon polyps 10/13/2018   IBS (irritable bowel syndrome)    Ingrown toenail 01/20/2020   Intractable migraine 04/28/2013   Formatting of this note might be different from the original. Overview:  Problem list name updated by automated process. Provider to review  Formatting of this note might be different from the original. Overview:  updating diagnosis code for icd10 cutover   Ischial bursitis of left side 07/01/2017   Added automatically from request for surgery [id number]   Low back pain 04/28/2016   Lumbar radiculopathy 04/28/2016   Migraine 10/16/2001   Problem list name updated by automated process. Provider to review Problem list name updated by automated process. Provider to review  updating diagnosis  code for icd10 cutover updating diagnosis code for icd10 cutover  updating diagnosis code for icd10 cutover updating diagnosis code for icd10 cutover  Migraine Without Aura Migraine Without Aura  Formatting of this note might be different from th   Migraine without aura, not refractory 07/29/2013   Formatting of this note might be different from the original. 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 Provider to review and confirm.) (Problem list name updated by automated process. Provider to review and confirm.)   Raynaud's syndrome without gangrene 10/08/2006   Formatting of this note might be different from the original. Overview:  (Problem list name updated by automated process. 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   Wolff-Parkinson-White syndrome     Patient Active Problem List   Diagnosis Date Noted   Abdominal bloating 12/10/2020   Microscopic colitis 12/10/2020   Migraines    IBS (irritable bowel syndrome)    GERD (gastroesophageal reflux disease)    Dry eye syndrome    High blood pressure    Allergies    Morning headache 08/02/2020   Excessive daytime sleepiness 06/04/2020   COVID-19 long hauler manifesting chronic concentration deficit 06/04/2020   Severe obstructive sleep apnea-hypopnea syndrome 06/04/2020   Anxiety 05/07/2020   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   Rosacea 05/07/2020   Chronic migraine without aura, with intractable migraine, so stated, with status migrainosus 03/07/2020   Raynaud's disease    Ingrown toenail 01/20/2020   Undifferentiated connective tissue  disease (HCC) 05/25/2019   Hemorrhoids 10/13/2018   History of colon polyps 10/13/2018   Cognitive complaints 05/14/2018   Other insomnia 05/14/2018   Reactive airway disease 04/13/2018   Scleroderma (HCC) 04/13/2018   Fibromyalgia 04/13/2018   Gastroesophageal reflux disease without esophagitis 04/13/2018   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 10/28/2017   Renal cyst 08/18/2017   Spondylosis of lumbar region without myelopathy or radiculopathy 07/17/2017   Ischial bursitis of left side 07/01/2017   Sacroiliitis (HCC) 06/05/2017   Irritable bowel syndrome with both constipation and diarrhea 04/17/2017   Thrombocytopenia (HCC) 04/17/2017   Low back pain 04/28/2016   Lumbar radiculopathy 04/28/2016   Acute confusional migraine, refractory 07/21/2014   Migraine without aura, not refractory 07/29/2013   Headache 04/28/2013   Intractable migraine 04/28/2013   Spondylolisthesis at L5-S1 level 11/29/2012   Mild major depression (HCC) 10/16/2009   Encounter for screening for cardiovascular disorders 03/11/2009   Essential hypertension 10/08/2006   Raynaud's phenomenon 10/08/2006   Raynaud's syndrome without gangrene 10/08/2006   Allergic rhinitis 10/16/2001   Migraine 10/16/2001    Past Surgical History:  Procedure Laterality Date   BACK SURGERY     lumbar fusion   CESAREAN SECTION  1999 and 1998   NECK  SURGERY     PARTIAL HYSTERECTOMY       OB History   No obstetric history on file.     Family History  Problem Relation Age of Onset   Heart attack Mother        triple bypass at 64 years old     Social History   Tobacco Use   Smoking status: Never   Smokeless tobacco: Never  Vaping Use   Vaping Use: Never used  Substance Use Topics   Alcohol use: Yes    Comment: social drinks rarely ("like a cocktail a month")   Drug use: Never    Home Medications Prior to Admission medications   Medication Sig Start Date End Date Taking? Authorizing Provider  ondansetron (ZOFRAN ODT) 4  MG disintegrating tablet Take 1 tablet (4 mg total) by mouth every 4 (four) hours as needed for nausea or vomiting. 12/14/20  Yes Terra Aveni, Lebron Conners, MD  potassium chloride SA (KLOR-CON) 20 MEQ tablet Take 1 tablet (20 mEq total) by mouth 2 (two) times daily. 12/14/20  Yes Arby Barrette, MD  ALPRAZolam Prudy Feeler) 1 MG tablet Take 0.5 mg by mouth as needed for anxiety. 07/13/18   [provider]  Botulinum Toxin Type A (BOTOX) 200 units SOLR Inject 200 Units into the skin See admin instructions. Every 90 days 02/08/20   [provider]  diltiazem (CARDIZEM) 30 MG tablet Take 30 mg by mouth daily.    [provider]  doxepin (SINEQUAN) 10 MG capsule Take 20 mg by mouth at bedtime. 12/06/19   [provider]  Erenumab-aooe (AIMOVIG) 140 MG/ML SOAJ Inject 140 mg into the skin every 30 (thirty) days. 07/09/20   Anson Fret, MD  losartan (COZAAR) 25 MG tablet Take 1 tablet (25 mg total) by mouth daily. 09/12/20 12/11/20  Georgeanna Lea, MD  metroNIDAZOLE (METROCREAM) 0.75 % cream Apply 1 application topically 2 (two) times daily. 12/05/19   [provider]  ondansetron (ZOFRAN-ODT) 4 MG disintegrating tablet Take 1-2 tablets (4-8 mg total) by mouth every 8 (eight) hours as needed for nausea. 03/07/20   Anson Fret, MD  pregabalin (LYRICA) 150 MG capsule Take 150 mg by mouth 3 (three) times daily.    [provider]  Rimegepant Sulfate (NURTEC) 75 MG TBDP Take 75 mg by mouth every other day. For migraines. 03/07/20   Anson Fret, MD  rizatriptan (MAXALT) 10 MG tablet Take 10 mg by mouth as needed for migraine. 06/13/18   [provider]  SAVELLA 50 MG TABS tablet Take 50 mg by mouth 2 (two) times daily. 10/17/19   [provider]  sertraline (ZOLOFT) 100 MG tablet Take 100 mg by mouth daily.    [provider]  topiramate (TOPAMAX) 100 MG tablet Take 100 mg by mouth 2 (two) times daily.    [provider]  tretinoin  (RETIN-A) 0.05 % cream Apply 1 application topically at bedtime. 07/02/20   [provider]  UBRELVY 100 MG TABS Take 100 mg by mouth daily. 03/28/20   [provider]  zolpidem (AMBIEN CR) 12.5 MG CR tablet Take 12.5 mg by mouth at bedtime as needed for sleep.    [provider]    Allergies    Codeine, Gluten meal, Lac bovis, Albuterol, Amlodipine besylate, and Milk-related compounds  Review of Systems   Review of Systems 10 systems reviewed and negative except as per HPI Physical Exam Updated Vital Signs BP 134/76 (BP Location: Left Arm)  Pulse 70   Temp 98 F (36.7 C) (Oral)   Resp 18   Ht 5' 3.5" (1.613 m)   Wt 55.8 kg   SpO2 100%   BMI 21.45 kg/m   Physical Exam Constitutional:      Appearance: Normal appearance.  HENT:     Head: Normocephalic and atraumatic.     Mouth/Throat:     Mouth: Mucous membranes are dry.  Eyes:     Extraocular Movements: Extraocular movements intact.     Conjunctiva/sclera: Conjunctivae normal.  Cardiovascular:     Rate and Rhythm: Normal rate and regular rhythm.  Pulmonary:     Effort: Pulmonary effort is normal.     Breath sounds: Normal breath sounds.  Abdominal:     General: There is no distension.     Palpations: Abdomen is soft.     Tenderness: There is no abdominal tenderness. There is no guarding.  Musculoskeletal:        General: No swelling or tenderness. Normal range of motion.     Cervical back: Neck supple.     Right lower leg: No edema.     Left lower leg: No edema.     Comments: Distal pedal pulses 2+ and symmetric.  Patient has some livedo reticularis appearance of bilateral lower legs which she identifies as chronic with her raynaud  Skin:    General: Skin is warm and dry.     Coloration: Skin is pale.  Neurological:     General: No focal deficit present.     Mental Status: She is alert and oriented to person, place, and time.  Psychiatric:        Mood and Affect: Mood normal.    ED  Results / Procedures / Treatments   Labs (all labs ordered are listed, but only abnormal results are displayed) Labs Reviewed  COMPREHENSIVE METABOLIC PANEL - Abnormal; Notable for the following components:      Result Value   Potassium 2.9 (*)    Total Protein 8.4 (*)    All other components within normal limits  CBC WITH DIFFERENTIAL/PLATELET - Abnormal; Notable for the following components:   WBC 3.9 (*)    RBC 5.49 (*)    Hemoglobin 17.4 (*)    HCT 51.1 (*)    All other components within normal limits  LIPASE, BLOOD  LACTIC ACID, PLASMA  URINALYSIS, ROUTINE W REFLEX MICROSCOPIC    EKG None  Radiology DG Abd Acute W/Chest  Result Date: 12/14/2020 CLINICAL DATA:  EGD yesterday, not feeling well, chills headache and abdominal pain. EXAM: DG ABDOMEN ACUTE WITH 1 VIEW CHEST COMPARISON:  None. FINDINGS: There is no evidence of bowel obstruction. There is no evidence of free intraperitoneal gas. Normal cardiomediastinal silhouette. No focal airspace consolidation. No large pleural effusion or visible pneumothorax. There is no acute osseous abnormality. L5-S1 fusion partially visualized is intact. IMPRESSION: Negative abdominal radiographs.  No acute cardiopulmonary disease. Electronically Signed   By: Caprice Renshaw   On: 12/14/2020 16:09    Procedures Procedures   Medications Ordered in ED Medications  potassium chloride 10 mEq in 100 mL IVPB (10 mEq Intravenous New Bag/Given 12/14/20 1704)  lactated ringers bolus 1,500 mL (0 mLs Intravenous Stopped 12/14/20 1815)  potassium chloride SA (KLOR-CON) CR tablet 40 mEq (40 mEq Oral Given 12/14/20 1705)    ED Course  I have reviewed the triage vital signs and the nursing notes.  Pertinent labs & imaging results that were available during my care of  the patient were reviewed by me and considered in my medical decision making (see chart for details).    MDM Rules/Calculators/A&P                           She reports feeling much improved  after hydration. She has had potassium replacement orally and IV.  She  is tolerating oral intake.  At this time findings are most consistent with dehydration and hypokalemia after preparation for upper and lower endoscopies.  Patient has not really been able to resume normal dietary intake since after the procedure.  She reports that she is not having severe pain.  After hydration she does not have any complaints of chest pain or abdominal pain.  At this time very low suspicion for any perforation or significant complication from procedure.  Plan will be to take potassium at home this evening and continue dietary intake and hydration.  Return precautions reviewed Final Clinical Impression(s) / ED Diagnoses Final diagnoses:  Hypokalemia  Dehydration    Rx / DC Orders ED Discharge Orders          Ordered    potassium chloride SA (KLOR-CON) 20 MEQ tablet  2 times daily        12/14/20 1839    ondansetron (ZOFRAN ODT) 4 MG disintegrating tablet  Every 4 hours PRN        12/14/20 1841             Arby Barrette, MD 12/14/20 1849

## 2020-12-14 NOTE — ED Triage Notes (Signed)
Reports she had egd/colonoscopy yesterday at noon.  Today c/o stomach ache, nausea, chills, and headache just doesn't feel good.

## 2020-12-14 NOTE — Discharge Instructions (Addendum)
1.  Take an additional 40 mEq of potassium this evening.  Then take 20 mEq twice daily for the next 3 to 4 days as you increase your dietary intake.  Recheck with your doctor in 2 to 3 days. 2.  Return to the emergency department if you are getting weak, any worsening symptoms or other concerning changes.

## 2020-12-17 ENCOUNTER — Other Ambulatory Visit: Payer: Self-pay

## 2020-12-17 ENCOUNTER — Ambulatory Visit (INDEPENDENT_AMBULATORY_CARE_PROVIDER_SITE_OTHER): Payer: Managed Care, Other (non HMO) | Admitting: Cardiology

## 2020-12-17 ENCOUNTER — Encounter: Payer: Self-pay | Admitting: Cardiology

## 2020-12-17 VITALS — BP 112/72 | HR 62 | Ht 63.5 in | Wt 122.0 lb

## 2020-12-17 DIAGNOSIS — Z860100 Personal history of colon polyps, unspecified: Secondary | ICD-10-CM

## 2020-12-17 DIAGNOSIS — I10 Essential (primary) hypertension: Secondary | ICD-10-CM | POA: Diagnosis not present

## 2020-12-17 DIAGNOSIS — I7300 Raynaud's syndrome without gangrene: Secondary | ICD-10-CM

## 2020-12-17 NOTE — Progress Notes (Signed)
Cardiology Office Note:    Date:  12/17/2020   ID:  Jennifer Reed, DOB 11-Aug-1965, MRN [id number]  PCP:  Adrienne Mocha, PA  Cardiologist:  Gypsy Balsam, MD    Referring MD: Adrienne Mocha, PA   Chief Complaint  Patient presents with   low potassium    History of Present Illness:    Jennifer Reed is a 55 y.o. female with past medical history significant for essential hypertension, Raynaud's phenomenon, questionable scleroderma.  She did have difficulty controlling her blood pressure but it looks like it is fine we now try to use calcium channel blockers since she does have Raynaud's phenomenon to help with her blood pressure however she was unable to tolerate it.  Recently she had colonoscopy done, strangely her preparation was 5 days so for 5 days she was taking MiraLAX every day eventually colonoscopy has been performed she was found to have some polyp as well as gastritis, her esophagus being stretched.  After that she end up being in the emergency room profoundly weak fatigued dehydrated as well as hypokalemic that has been corrected.  I did review record from the emergency room for that visit. For the complaint being weak tired exhausted denies have any chest pain tightness squeezing pressure burning chest  Past Medical History:  Diagnosis Date   Acute confusional migraine, refractory 07/21/2014   Allergic rhinitis 10/16/2001   Rhinitis Allergic  NOS Rhinitis Allergic  NOS  Formatting of this note might be different from the original. Overview:  Rhinitis Allergic  NOS   Allergies    Anxiety 05/07/2020   Chronic migraine without aura, with intractable migraine, so stated, with status migrainosus 03/07/2020   Cognitive complaints 05/14/2018   COVID-19 long hauler manifesting chronic concentration deficit 06/04/2020   Dry eye syndrome    Encounter for screening for cardiovascular disorders 03/11/2009   Essential hypertension 10/08/2006   (Problem list name updated by automated  process. Provider to review and confirm.) (Problem list name updated by automated process. Provider to review and confirm.)  Formatting of this note might be different from the original. Overview:  (Problem list name updated by automated process. Provider to review and confirm.)   Excessive daytime sleepiness 06/04/2020   Fibromyalgia    Gastritis    Gastroesophageal reflux disease without esophagitis 04/13/2018   GERD (gastroesophageal reflux disease)    Headache 04/28/2013   Problem list name updated by automated process. Provider to review Problem list name updated by automated process. Provider to review  Formatting of this note might be different from the original. Overview:  Problem list name updated by automated process. Provider to review   Hemorrhoids 10/13/2018   High blood pressure    History of colon polyps 10/13/2018   IBS (irritable bowel syndrome)    Ingrown toenail 01/20/2020   Intractable migraine 04/28/2013   Formatting of this note might be different from the original. Overview:  Problem list name updated by automated process. Provider to review  Formatting of this note might be different from the original. Overview:  updating diagnosis code for icd10 cutover   Ischial bursitis of left side 07/01/2017   Added automatically from request for surgery [id number]   Low back pain 04/28/2016   Lumbar radiculopathy 04/28/2016   Migraine 10/16/2001   Problem list name updated by automated process. Provider to review Problem list name updated by automated process. Provider to review  updating diagnosis code for icd10 cutover updating diagnosis code for icd10 cutover  updating diagnosis  code for icd10 cutover updating diagnosis code for icd10 cutover  Migraine Without Aura Migraine Without Aura  Formatting of this note might be different from th   Migraine without aura, not refractory 07/29/2013   Formatting of this note might be different from the original. Overview:  updating diagnosis  code for icd10 cutover   Migraines    Mild major depression (HCC) 10/16/2009   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   Morning headache 08/02/2020   Raynaud's disease    Raynaud's phenomenon 10/08/2006   (Problem list name updated by automated process. Provider to review and confirm.) (Problem list name updated by automated process. Provider to review and confirm.)   Raynaud's syndrome without gangrene 10/08/2006   Formatting of this note might be different from the original. Overview:  (Problem list name updated by automated process. 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   Botulinum Toxin Type A (BOTOX) 200 units SOLR Inject 200 Units into the skin See admin instructions. Every 90 days   diltiazem (CARDIZEM) 30 MG tablet Take 30 mg by mouth daily.   doxepin (SINEQUAN) 10 MG capsule Take 20 mg by mouth at bedtime.   Erenumab-aooe (AIMOVIG) 140 MG/ML SOAJ Inject 140 mg into the skin every 30 (thirty) days.   losartan (COZAAR) 25 MG tablet Take 1 tablet (25 mg total) by mouth daily.    metroNIDAZOLE (METROCREAM) 0.75 % cream Apply 1 application topically 2 (two) times daily.   ondansetron (ZOFRAN ODT) 4 MG disintegrating tablet Take 1 tablet (4 mg total) by mouth every 4 (four) hours as needed for nausea or vomiting.   ondansetron (ZOFRAN-ODT) 4 MG disintegrating tablet Take 1-2 tablets (4-8 mg total) by mouth every 8 (eight) hours as needed for nausea.   potassium chloride SA (KLOR-CON) 20 MEQ tablet Take 1 tablet (20 mEq total) by mouth 2 (two) times daily.   pregabalin (LYRICA) 150 MG capsule Take 150 mg by mouth 3 (three) times daily.   Rimegepant Sulfate (NURTEC) 75 MG TBDP Take 75 mg by mouth every other day. For migraines.   rizatriptan (MAXALT) 10 MG tablet Take 10 mg by mouth as needed for migraine.   SAVELLA 50 MG TABS tablet Take 50 mg by mouth 2 (two) times daily.   sertraline (ZOLOFT) 100 MG tablet Take 100 mg by mouth daily.   topiramate (TOPAMAX) 100 MG tablet Take 100 mg by mouth 2 (two) times daily.   tretinoin (RETIN-A) 0.05 % cream Apply 1 application topically at bedtime.   UBRELVY 100 MG TABS Take 100 mg by mouth daily.  zolpidem (AMBIEN CR) 12.5 MG CR tablet Take 12.5 mg by mouth at bedtime as needed for sleep.     Allergies:   Codeine, Gluten meal, Lac bovis, Albuterol, Amlodipine besylate, and Milk-related compounds   Social History   Socioeconomic History   Marital status: Married    Spouse name: Not on file   Number of children: 2   Years of education: Not on file   Highest education level: Not on file  Occupational History   Not on file  Tobacco Use   Smoking status: Never   Smokeless tobacco: Never  Vaping Use   Vaping Use: Never used  Substance and Sexual Activity   Alcohol use: Yes    Comment: social drinks rarely ("like a cocktail a month")   Drug use: Never   Sexual activity: Not on file  Other Topics Concern   Not on file  Social History Narrative   Lives at home with husband and dog   Right handed   Caffeine: n/a     Social Determinants of Health   Financial Resource Strain: Not on file  Food Insecurity: Not on file  Transportation Needs: Not on file  Physical Activity: Not on file  Stress: Not on file  Social Connections: Not on file     Family History: The patient's family history includes Heart attack in her mother. ROS:   Please see the history of present illness.    All 14 point review of systems negative except as described per history of present illness  EKGs/Labs/Other Studies Reviewed:      Recent Labs: 12/14/2020: ALT 14; BUN 7; Creatinine, Ser 0.80; Hemoglobin 17.4; Platelets 186; Potassium 2.9; Sodium 138  Recent Lipid Panel No results found for: CHOL, TRIG, HDL, CHOLHDL, VLDL, LDLCALC, LDLDIRECT  Physical Exam:    VS:  BP 112/72 (BP Location: Left Arm, Patient Position: Sitting)   Pulse 62   Ht 5' 3.5" (1.613 m)   Wt 122 lb (55.3 kg)   SpO2 98%   BMI 21.27 kg/m     Wt Readings from Last 3 Encounters:  12/17/20 122 lb (55.3 kg)  12/14/20 123 lb (55.8 kg)  09/12/20 123 lb (55.8 kg)     GEN:  Well nourished, well developed in no acute distress HEENT: Normal NECK: No JVD; No carotid bruits LYMPHATICS: No lymphadenopathy CARDIAC: RRR, no murmurs, no rubs, no gallops RESPIRATORY:  Clear to auscultation without rales, wheezing or rhonchi  ABDOMEN: Soft, non-tender, non-distended MUSCULOSKELETAL:  No edema; No deformity  SKIN: Warm and dry LOWER EXTREMITIES: no swelling NEUROLOGIC:  Alert and oriented x 3 PSYCHIATRIC:  Normal affect   ASSESSMENT:    1. Essential hypertension   2. Raynaud's syndrome without gangrene   3. Raynaud's phenomenon without gangrene   4. History of colon polyps    PLAN:    In order of problems listed above:  Essential hypertension blood pressure target continue present management. Raynaud's phenomenon.  Still bothering her some.  "Questionable scleroderma.  Follow-up by primary care physician History of colon polyp: That being  removed recently. Hypokalemia which was caused by dehydration and diarrhea caused by MiraLAX.  She has been given 20 mill like equivalents of potassium twice daily that she has been taking since Friday.  I will check her potassium today and decide what to do about potassium.  She does have a list of food products that are high in potassium.  I am worried that we may be dealing with too high potassium now.  Medication Adjustments/Labs and Tests Ordered: Current medicines are reviewed at length with the patient today.  Concerns regarding medicines are outlined above.  No orders of the defined types were placed in this encounter.  Medication changes: No orders of the defined types were placed in this encounter.   Signed, Georgeanna Leaobert J. Victorhugo Preis, MD, [facility] HospitalFACC 12/17/2020 1:37 PM    [facility]

## 2020-12-17 NOTE — Patient Instructions (Signed)
Medication Instructions:  Your physician recommends that you continue on your current medications as directed. Please refer to the Current Medication list given to you today.  *If you need a refill on your cardiac medications before your next appointment, please call your pharmacy*   Lab Work: Your physician recommends that you return for lab work in:  TODAY: BMET If you have labs (blood work) drawn today and your tests are completely normal, you will receive your results only by: MyChart Message (if you have MyChart) OR A paper copy in the mail If you have any lab test that is abnormal or we need to change your treatment, we will call you to review the results.   Testing/Procedures: None   Follow-Up: At [facility], you and your health needs are our priority.  As part of our continuing mission to provide you with exceptional heart care, we have created designated Provider Care Teams.  These Care Teams include your primary Cardiologist (physician) and Advanced Practice Providers (APPs -  Physician Assistants and Nurse Practitioners) who all work together to provide you with the care you need, when you need it.  We recommend signing up for the patient portal called "MyChart".  Sign up information is provided on this After Visit Summary.  MyChart is used to connect with patients for Virtual Visits (Telemedicine).  Patients are able to view lab/test results, encounter notes, upcoming appointments, etc.  Non-urgent messages can be sent to your provider as well.   To learn more about what you can do with MyChart, go to [email or web address].    Your next appointment:   6 months  The format for your next appointment:   In Person  Provider:   Robert Krasowski, MD   Other Instructions   

## 2020-12-18 ENCOUNTER — Telehealth: Payer: Self-pay | Admitting: *Deleted

## 2020-12-18 LAB — BASIC METABOLIC PANEL
BUN/Creatinine Ratio: 11 (ref 9–23)
BUN: 8 mg/dL (ref 6–24)
CO2: 20 mmol/L (ref 20–29)
Calcium: 9.2 mg/dL (ref 8.7–10.2)
Chloride: 104 mmol/L (ref 96–106)
Creatinine, Ser: 0.71 mg/dL (ref 0.57–1.00)
Glucose: 69 mg/dL (ref 65–99)
Potassium: 3.9 mmol/L (ref 3.5–5.2)
Sodium: 140 mmol/L (ref 134–144)
eGFR: 102 mL/min/{1.73_m2} (ref >59)

## 2020-12-18 NOTE — Telephone Encounter (Signed)
Received fax from [facility] for Nurtec prescription. Per phone note on 7/12, Nurtec order should be for

## 2020-12-21 ENCOUNTER — Other Ambulatory Visit: Payer: Self-pay

## 2020-12-21 DIAGNOSIS — Z79899 Other long term (current) drug therapy: Secondary | ICD-10-CM

## 2020-12-24 ENCOUNTER — Telehealth: Payer: Self-pay | Admitting: Cardiology

## 2020-12-24 MED ORDER — POTASSIUM CHLORIDE CRYS ER 20 MEQ PO TBCR: 20.0000 meq | EXTENDED_RELEASE_TABLET | Freq: Two times a day (BID) | ORAL | 3 refills | Status: DC

## 2020-12-24 NOTE — Telephone Encounter (Signed)
*  STAT* If patient is at the pharmacy, call can be transferred to refill team.   1. Which medications need to be refilled? (please list name of each medication and dose if known) klor-con m 20 mg  2. Which pharmacy/location (including street and city if local pharmacy) is medication to be sent to? [facility]  3. Do they need a 30 day or 90 day supply? Not sure patient is asking for the nurse

## 2020-12-24 NOTE — Telephone Encounter (Signed)
Refill sent in per request.  

## 2020-12-31 ENCOUNTER — Other Ambulatory Visit: Payer: Self-pay | Admitting: Neurology

## 2020-12-31 ENCOUNTER — Other Ambulatory Visit: Payer: Self-pay

## 2020-12-31 DIAGNOSIS — Z79899 Other long term (current) drug therapy: Secondary | ICD-10-CM

## 2021-01-01 ENCOUNTER — Other Ambulatory Visit: Payer: Self-pay | Admitting: Neurology

## 2021-01-01 LAB — BASIC METABOLIC PANEL
BUN/Creatinine Ratio: 17 (ref 9–23)
BUN: 13 mg/dL (ref 6–24)
CO2: 21 mmol/L (ref 20–29)
Calcium: 9.4 mg/dL (ref 8.7–10.2)
Chloride: 103 mmol/L (ref 96–106)
Creatinine, Ser: 0.75 mg/dL (ref 0.57–1.00)
Glucose: 82 mg/dL (ref 65–99)
Potassium: 4.1 mmol/L (ref 3.5–5.2)
Sodium: 139 mmol/L (ref 134–144)
eGFR: 95 mL/min/{1.73_m2} (ref >59)

## 2021-01-04 ENCOUNTER — Telehealth: Payer: Self-pay

## 2021-01-07 ENCOUNTER — Telehealth: Payer: Self-pay

## 2021-01-07 NOTE — Telephone Encounter (Signed)
I submitted a PA request for Aimovig 140mg  on CMM, Key: [id number].   Awaiting determination from [facility].

## 2021-01-08 DIAGNOSIS — I10 Essential (primary) hypertension: Secondary | ICD-10-CM

## 2021-01-15 NOTE — Telephone Encounter (Signed)
Received a form from [employer] [facility] asking for additional information. I have completed this and faxed back.

## 2021-01-21 NOTE — Telephone Encounter (Signed)
Received a fax from [employer]. Aimovig 140 mg has been approved from 01/11/21-01/11/22. Approval faxed to pharmacy. Received a receipt of confirmation.

## 2021-01-23 ENCOUNTER — Other Ambulatory Visit: Payer: Self-pay | Admitting: Neurology

## 2021-02-16 ENCOUNTER — Other Ambulatory Visit: Payer: Self-pay | Admitting: Cardiology

## 2021-02-19 ENCOUNTER — Ambulatory Visit (INDEPENDENT_AMBULATORY_CARE_PROVIDER_SITE_OTHER): Payer: Managed Care, Other (non HMO) | Admitting: Neurology

## 2021-02-19 ENCOUNTER — Other Ambulatory Visit: Payer: Self-pay

## 2021-02-19 ENCOUNTER — Telehealth: Payer: Self-pay | Admitting: Neurology

## 2021-02-19 ENCOUNTER — Encounter: Payer: Self-pay | Admitting: Neurology

## 2021-02-19 VITALS — BP 129/86 | HR 96 | Ht 64.5 in | Wt 128.2 lb

## 2021-02-19 DIAGNOSIS — F902 Attention-deficit hyperactivity disorder, combined type: Secondary | ICD-10-CM | POA: Diagnosis not present

## 2021-02-19 DIAGNOSIS — G43001 Migraine without aura, not intractable, with status migrainosus: Secondary | ICD-10-CM | POA: Diagnosis not present

## 2021-02-19 DIAGNOSIS — F5104 Psychophysiologic insomnia: Secondary | ICD-10-CM | POA: Insufficient documentation

## 2021-02-19 DIAGNOSIS — G43711 Chronic migraine without aura, intractable, with status migrainosus: Secondary | ICD-10-CM | POA: Diagnosis not present

## 2021-02-19 DIAGNOSIS — R419 Unspecified symptoms and signs involving cognitive functions and awareness: Secondary | ICD-10-CM

## 2021-02-19 MED ORDER — NURTEC 75 MG PO TBDP: 75.0000 mg | ORAL_TABLET | ORAL | 11 refills | Status: DC

## 2021-02-19 MED ORDER — AIMOVIG 140 MG/ML ~~LOC~~ SOAJ: 140.0000 mg | SUBCUTANEOUS | 11 refills | Status: DC

## 2021-02-19 MED ORDER — UBRELVY 100 MG PO TABS: 100.0000 mg | ORAL_TABLET | Freq: Every day | ORAL | 11 refills | Status: DC

## 2021-02-19 NOTE — Telephone Encounter (Signed)
Patient would like to start coming here for Botox.  She is having Botox at the [facility] [facility] on November 1.  Please schedule her with me 3 months after that.  I am not sure if her insurance will allow it but if she can she is welcome to come to [place].

## 2021-02-19 NOTE — Patient Instructions (Addendum)
-   Discussed sleep hygiene and sleep therapy -Discussed polypharmacy likely causing her cognitive dysfunction she is on multiple medications that can cause brain fog -Recommended titrating off of Topamax she can start by going down half a pill in the morning or we can get her 25 mg pills to slowly go down Continue Nurtec and Ubrelvy and Aimovig is doing great with her migraines -She appears to have ADHD, I recommended she look into the [facility] attention center as they appear to be a multifactorial group that can help treat her and also teach her some compensatory measures -Transition here for Botox

## 2021-02-19 NOTE — Progress Notes (Addendum)
[facility]    Provider:  Dr Lucia Gaskins Requesting Provider: Adrienne Mocha, PA Primary Care Provider:  Wilfrid Lund, PA  CC:  migraines and memor loss  Addendum 09/09/2021; this is a patient with pressured speech, tangential, very distractible, she has been complaining of inattention and memory issues, I sent her for evaluation for ADHD at [facility] and I received a note back from Dr. Elisabeth Most was stated "not in attention or ADHD issue.  I believe her issue was with memory and cognitively not processing information, short-term working memory.  Stimulus's have caused side effects and she will not be able to tolerate.  She worries but Wilhemena Durie will not work and risk of adding another serotonin medication.  Recommend neurocognitive testing a specific evaluation for memory" I do wonder if patient is bipolar or has other psychiatric issue causing this hypomania I am not really sure, we will refer her to Dr. Clayborn Heron, she already has an appointment.  Addendum 06/01/2021: Email from patient that she has to wait months to see an adhd specialist and has to wait to get medication that may help her if she has adhd. We are sorry about that but I am not sure patient has ADHD but I think she needs to be evaluated for it.  Even though we suspect possible concomitant adhd, there are a lot of other things that may be causing her symptoms.,she is on a lot of medications including Topamax, Lyrica, Ambien, doxepin, benzodiazepine and she needs help with her insomnia which can also cause memory/inattention issues. I don;t think it is just that easy as she needs ADHD medication, She needs a comprehensive evaluation of her current medications, formal eval for ADHD and help with insomnia which is why we sent her to a comprehensive [facility] center.  She can call her insurance and get a list of doctors in the area that manage or may specialize in adhd and we would be happy  to refer her elsewhere. She can also speak with her primary care doctor. We will continue to help her with her migraines.   February 19, 2021: Patient is here and as far as her migraines go she has feeling awesome.Nurtec qod working great, Arts development officer working great for acute. Aimovig working well. Headaches doing so well, she is thrilled.  We reviewed the MRI of her brain today, personally reviewed images and agree, unremarkable for age and medical conditions.  Here today for short-term memory issues, she will jump around, can't focus, procrastinate, she detours a lot. Since Covid 2 years ago she has not read a book, she had covid, these may be all long-covid symptoms. She has insomnia and we discussed all the medications she is on. Discussed titrating slowly. Normal cognitive aging.    We had a very long talk today I think that her concerns are due to multifactorial issues including: polypharmacy including Topamax, Lyrica, Ambien, doxepin, benzodiazepine and multiple other medications.  I think she also has untreated ADHD.  Also discussed normal cognitive aging.  Given her insomnia I think that is more behavioral I think she needs a sleep therapist and we had a very long discussion about proper sleep etiquette.  I recommended that maybe we start by slowly titrating off Topamax, if she needs 25 mg pills happy to give it to her, she can go see "[facility] attention specialist" has ADHD not only needs medication but also compensatory skills and it appears this is a multidisciplinary center for ADHD, then  when she can get some help with insomnia we can start taking her off some of the medications she takes for sleeping.  I think she will feel much better.  In the meantime we will continue to treat her migraines and transition her here for Botox.   IMPRESSION: This MRI of the brain with and without contrast shows the following: 1.  Scattered T2/FLAIR hyperintense foci in the subcortical and deep white matter of  the hemispheres.  This is a nonspecific finding and most likely represents minimal chronic microvascular ischemic changes or sequela of migraine headaches.  None of the foci enhance or appear to be acute. 2.   Left frontal developmental venous anomaly. 3.   No acute findings.     HPI:  Ellean Firman is a 55 y.o. female here as requested by Adrienne Mocha, PA for migraines.  Past medical history migraine without aura, major depressive disorder recurrent moderate, scleroderma, left renal mass, rosacea, fibromyalgia, anxiety, Raynaud's phenomena, primary insomnia.  I reviewed [name]'s notes: It appears patient is already on Aimovig, sending to [place] here for evaluation of Botox injections for migraines, general examination was normal including nose ears eyes throat, lungs, heart, musculoskeletal, psychiatric and neurologic, no details on patient's migraine disorder provided which would be helpful when referring to a specialist.  I was able to review epic notes, patient was last seen at [facility] for Botox injections just a few days ago, unclear why she is being sent here for Botox if she is already seen at a headache clinic in the area.  It appears she is already tried Flexeril, tizanidine, Topamax, Trokendi, gabapentin, Lyrica, amitriptyline, Celexa, Lexapro, fluoxetine, Paxil, Zoloft, Cymbalta, Savella, doxepin, Aimovig, Botox, dry needling, yoga, acupuncture, trigger point injections and Botox.  She has also been seen in neurosurgery earlier this month for chronic neck pain.  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  Echo notes going back to at least 2008 for her migraines.  Patient is here alone, as above she is already a patient at a headache clinic and received botox and is on CGRP. She does PT for her neck at [facility]. They are working on her posture, dry needling. She has such huge knots. She has been to pain management and continues to go, she has had steroid injections. She was in [place] and had a neurologist for 10 years, she has been here 3 years, she has seen [facility] neurology. She does not know what she wants to focus on today, she wakes up with migraines, she has difficulty falling asleep and she can't stay asleep, she wakes up with headaches 4-5 times a week. She loved Aimovig* but insurance wouldn't pay for both. She felt being on both helped. She has TMJ. She treats her headaches with tylenol, tried [place]. She has at least one migraine that puts her in bed for 3-4 days severe but she also has moderately severe migraines al most every day. She  has a headache every day. No aura. NO medication overuse. She does not know what she wants to focus on today, she wakes up with migraines, she has difficulty falling asleep and she can't stay asleep, she wakes up with headaches 4-5 times a week. She has new diplopia.   .  It appears she is already tried Flexeril, tizanidine, Topamax, Trokendi, gabapentin, Lyrica, amitriptyline, Celexa, Lexapro, fluoxetine, Paxil, Zoloft, Cymbalta, Savella, doxepin, Aimovig, Botox, dry needling, yoga, acupuncture, trigger point injections and Botox, Maxalt, Fioricet, propranolol, amlodipine     Review of Systems: Patient complains of symptoms per HPI as well as the following symptoms: [place]ia . Pertinent negatives and positives per HPI. All others negative    Social History   Socioeconomic History   Marital status: Married    Spouse name: Not on file   Number of  children: 2   Years of education: Not on file   Highest education level: Not on file  Occupational History   Not on file  Tobacco Use   Smoking status: Never    Passive exposure: Never   Smokeless tobacco: Never  Vaping Use   Vaping Use: Never used  Substance and Sexual Activity   Alcohol use: Yes    Comment: social drinks rarely ("like a cocktail a month")   Drug use: Never   Sexual activity: Not on file  Other Topics Concern   Not on file  Social History Narrative   Lives at home with husband and dog   Right handed   Caffeine: n/a    Social Determinants of Health   Financial Resource Strain: Not on file  Food Insecurity: Not on file  Transportation Needs: Not on file  Physical Activity: Not on file  Stress: Not on file  Social Connections: Not on file  Intimate Partner Violence: Not on file    Family History  Problem Relation Age of Onset   Heart attack Mother        triple bypass at 61 years old     Past Medical History:  Diagnosis Date   Acute confusional migraine, refractory 07/21/2014   Allergic rhinitis 10/16/2001   Rhinitis Allergic  NOS Rhinitis Allergic  NOS  Formatting of this note might be different from the original. Overview:  Rhinitis Allergic  NOS   Allergies    Anxiety 05/07/2020   Chronic migraine without aura, with intractable migraine, so stated, with status migrainosus 03/07/2020   Cognitive complaints 05/14/2018   COVID-19 long hauler manifesting chronic concentration deficit 06/04/2020   Dry eye syndrome    Encounter for screening for cardiovascular disorders 03/11/2009   Essential hypertension 10/08/2006   (Problem list name updated by automated process. Provider to review and confirm.) (Problem list name updated by automated process. Provider to review and confirm.)  Formatting of this note might be different from the original. Overview:  (Problem list name updated by automated process. Provider to review and confirm.)   Excessive daytime  sleepiness 06/04/2020   Fibromyalgia    Gastritis    Gastroesophageal reflux disease without esophagitis 04/13/2018   GERD (gastroesophageal reflux disease)    Headache 04/28/2013   Problem list name updated by automated process. Provider to review Problem list name updated by automated process. Provider to review  Formatting of this note might be different from the original. Overview:  Problem list name updated by automated process. Provider to review   Hemorrhoids 10/13/2018   High blood pressure    History of colon polyps 10/13/2018  IBS (irritable bowel syndrome)    Ingrown toenail 01/20/2020   Intractable migraine 04/28/2013   Formatting of this note might be different from the original. Overview:  Problem list name updated by automated process. Provider to review  Formatting of this note might be different from the original. Overview:  updating diagnosis code for icd10 cutover   Ischial bursitis of left side 07/01/2017   Added automatically from request for surgery [id number]   Low back pain 04/28/2016   Lumbar radiculopathy 04/28/2016   Migraine 10/16/2001   Problem list name updated by automated process. Provider to review Problem list name updated by automated process. Provider to review  updating diagnosis code for icd10 cutover updating diagnosis code for icd10 cutover  updating diagnosis code for icd10 cutover updating diagnosis code for icd10 cutover  Migraine Without Aura Migraine Without Aura  Formatting of this note might be different from th   Migraine without aura, not refractory 07/29/2013   Formatting of this note might be different from the original. Overview:  updating diagnosis code for icd10 cutover   Migraines    Mild major depression (HCC) 10/16/2009   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   Morning headache 08/02/2020   Raynaud's disease    Raynaud's phenomenon 10/08/2006   (Problem list name updated by automated process. Provider to review and  confirm.) (Problem list name updated by automated process. Provider to review and confirm.)   Raynaud's syndrome without gangrene 10/08/2006   Formatting of this note might be different from the original. Overview:  (Problem list name updated by automated process. 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   Wolff-Parkinson-White syndrome     Patient Active Problem List   Diagnosis Date Noted   Gastritis 06/28/2021   Attention deficit hyperactivity disorder (ADHD), combined type 02/19/2021   Psychophysiological insomnia 02/19/2021   Abdominal bloating 12/10/2020   Microscopic colitis 12/10/2020   Migraines    IBS (irritable bowel syndrome)    GERD (gastroesophageal reflux disease)    Dry eye syndrome    High blood pressure    Allergies    Morning headache 08/02/2020   Excessive daytime sleepiness 06/04/2020   COVID-19 long hauler manifesting chronic concentration deficit 06/04/2020   Severe obstructive sleep apnea-hypopnea syndrome 06/04/2020   Anxiety 05/07/2020   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   Rosacea 05/07/2020   Chronic migraine without aura, with intractable migraine, so stated, with status migrainosus 03/07/2020   Raynaud's disease    Ingrown toenail 01/20/2020   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   Hemorrhoids 10/13/2018   History of colon polyps 10/13/2018   Cognitive  complaints 05/14/2018   Other insomnia 05/14/2018   Reactive airway disease 04/13/2018   Scleroderma (HCC) 04/13/2018   Fibromyalgia 04/13/2018   Gastroesophageal reflux disease without esophagitis 04/13/2018   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 10/28/2017   Renal cyst 08/18/2017   Spondylosis of lumbar region without myelopathy or radiculopathy 07/17/2017   Ischial bursitis of left side 07/01/2017  Sacroiliitis (HCC) 06/05/2017   Irritable bowel syndrome with both constipation and diarrhea 04/17/2017   Thrombocytopenia (HCC) 04/17/2017   Low back pain 04/28/2016   Lumbar radiculopathy 04/28/2016   Acute confusional migraine, refractory 07/21/2014   Migraine without aura, not refractory 07/29/2013   Headache 04/28/2013   Intractable migraine 04/28/2013   Spondylolisthesis at L5-S1 level 11/29/2012   Mild major depression (HCC) 10/16/2009   Encounter for screening for cardiovascular disorders 03/11/2009   Essential hypertension 10/08/2006   Raynaud's phenomenon 10/08/2006   Raynaud's syndrome without gangrene 10/08/2006   Allergic rhinitis 10/16/2001   Migraine 10/16/2001    Past Surgical History:  Procedure Laterality Date   ABDOMINAL HYSTERECTOMY     BACK SURGERY     lumbar fusion   CESAREAN SECTION  1999 and 1998   NECK SURGERY     PARTIAL HYSTERECTOMY      Current Outpatient Medications  Medication Sig Dispense Refill   ALPRAZolam (XANAX) 1 MG tablet Take 0.5 mg by mouth as needed for anxiety.     metroNIDAZOLE (METROCREAM) 0.75 % cream Apply 1 application topically 2 (two) times daily.     pregabalin (LYRICA) 150 MG capsule Take 150 mg by mouth 3 (three) times daily.     SAVELLA 50 MG TABS tablet Take 50 mg by mouth 2 (two) times daily.     sertraline (ZOLOFT) 100 MG tablet Take 100 mg by mouth daily.     tretinoin (RETIN-A) 0.05 % cream Apply 1 application topically at bedtime.     zolpidem (AMBIEN CR) 12.5 MG CR tablet Take 12.5 mg by mouth at bedtime as needed for sleep.  (Patient not taking: Reported on 09/04/2021)     Botulinum Toxin Type A (BOTOX) 200 units SOLR Provider to inject 155 units into the muscles of the head and neck every 3 months. Discard remainder. 1 each 1   Erenumab-aooe (AIMOVIG) 140 MG/ML SOAJ Inject 140 mg into the skin every 30 (thirty) days. 1 mL 11   losartan (COZAAR) 25 MG tablet Take 1 tablet (25 mg total) by mouth daily. 90 tablet 3   Rimegepant Sulfate (NURTEC) 75 MG TBDP Take 75 mg by mouth every other day. For migraines. 16 tablet 11   topiramate (TOPAMAX) 50 MG tablet Take 2 tablets (100 mg total) by mouth 2 (two) times daily. 60 tablet 0   UBRELVY 100 MG TABS Take 100 mg by mouth daily. 16 tablet 11   No current facility-administered medications for this visit.    Allergies as of 02/19/2021 - Review Complete 02/19/2021  Allergen Reaction Noted   Codeine Nausea And Vomiting and Other (See Comments) 12/03/1997   Gluten meal Other (See Comments) 10/28/2017   Lac bovis Other (See Comments) 03/05/2017   Albuterol Other (See Comments) 03/07/2020   Amlodipine besylate Other (See Comments) 04/19/2020   Milk-related compounds  04/13/2018    Vitals: BP 129/86   Pulse 96   Ht 5' 4.5" (1.638 m)   Wt 128 lb 3.2 oz (58.2 kg)   BMI 21.67 kg/m  Last Weight:  Wt Readings from Last 1 Encounters:  07/21/21 120 lb (54.4 kg)   Last Height:   Ht Readings from Last 1 Encounters:  07/21/21 5' 3.5" (1.613 m)    Exam: NAD, pleasant                  Speech:    Speech is normal; fluent and spontaneous with normal comprehension.  Cognition:    The patient is oriented  to person, place, and time;     recent and remote memory intact;     language fluent;    Cranial Nerves:    The pupils are equal, round, and reactive to light.Trigeminal sensation is intact and the muscles of mastication are normal. The face is symmetric. The palate elevates in the midline. Hearing intact. Voice is normal. Shoulder shrug is normal. The tongue has normal  motion without fasciculations.   Coordination:  No dysmetria  Motor Observation:    No asymmetry, no atrophy, and no involuntary movements noted. Tone:    Normal muscle tone.     Strength:    Strength is V/V in the upper and lower limbs.      Sensation: intact to LT   Assessment/Plan:   Kadyn Prizzi is a 55 y.o. female here as requested by Adrienne Mocha, PA for intractable migraines. Today here for short-term memory loss and difficulty concentrating. Migraines doing great on nurtec, ubrelvy and aimovig. On botox  Addendum 09/09/2021; this is a patient with pressured speech, tangential, very distractible, she has been complaining of inattention and memory issues, I sent her for evaluation for ADHD at [facility] and I received a note back from Dr. Elisabeth Most was stated "not in attention or ADHD issue.  I believe her issue was with memory and cognitively not processing information, short-term working memory.  Stimulus's have caused side effects and she will not be able to tolerate.  She worries but Wilhemena Durie will not work and risk of adding another serotonin medication.  Recommend neurocognitive testing a specific evaluation for memory" I do wonder if patient is bipolar or has other psychiatric issue causing this hypomania I am not really sure, we will refer her to Dr. Clayborn Heron, she already has an appointment.  (Addendum 06/01/2021: Email from patient that she has to wait months to see an adhd specialist and has to wait to get medication that may help her if she has adhd. We are sorry about that but I am not sure patient has ADHD but I think she needs to be evaluated for it.  Even though we suspect possible concomitant adhd, there are a lot of other things that may be causing her symptoms.,she is on a lot of medications including Topamax, Lyrica, Ambien, doxepin, benzodiazepine and she needs help with her insomnia which can also cause memory/inattention issues. I don;t think it  is just that easy as she needs ADHD medication, She needs a comprehensive evaluation of her current medications, formal eval for ADHD and help with insomnia which is why we sent her to a comprehensive [facility] center.  She can call her insurance and get a list of doctors in the area that manage or may specialize in adhd and we would be happy to refer her elsewhere. She can also speak with her primary care doctor. We will continue to help her with her migraines.)  We had a very long talk today I think that her concerns are due to multifactorial issues including: polypharmacy including Topamax, Lyrica, Ambien, doxepin, benzodiazepine and multiple other medications.  I think she also has untreated ADHD.  Also discussed normal cognitive aging.  Given her insomnia I think that is more behavioral I think she needs a sleep therapist and we had a very long discussion about proper sleep etiquette.  I recommended that maybe we start by slowly titrating off Topamax, if she needs 25 mg pills happy to give it to her, she can go see "  [place] attention specialist" has ADHD not only needs medication but also compensatory skills and it appears this is a multidisciplinary center for ADHD, then when she can get some help with insomnia we can start taking her off some of the medications she takes for sleeping.  I think she will feel much better.  In the meantime we will continue to treat her migraines and transition her here for Botox.  PRIOR ASSESSMENT AND PLAN:02/2020  - Patient has been to multiple neurologists, headache centers, spine specialists(neck pain) and currently in pain management. Has been under the care of multiple doctors for her migraines for over 10 years or much longer(was with a neurologist for 10 years in [place] and currently a patient at the [facility], saw neurology at [facility] as well see H&P). She has tried and failed multiple medications, is on Botox, and still states that  she she has daily headache burden. - Today we discussed having an MRI of the brain, sleep evaluation(morning headaches), continuing botox, trying Nurtec preventatively (do not take Nurtec with Ubrelvy) and gave her 6 months of Aimovig samples. - Morning headaches, wakes up with them, this is an indication for a sleep eval and sleep study. - I'm afraid choices are limited in this patient since she has already tried so much and we may want to discuss cognitive behavioral therapy, biofeedback and ask her to explore with a therapist any past traumatic or psychiatric history that may predispose her to intractable migraines. She was in no distress today and appeared very well. - I discussed medication overuse and advised her not to take acute management such as triptans, analgesics etc more than 10x a month - If she wants to switch her botox injections over to [facility], we can do that but I  - I had a frank talk with patient that given her chronicity despite multiple specialist care I am not sure we are the right center for her and an academic center may have more to offer her, she declines.  - She uses doxepin, ambien for insomnia, I recommend sleep therapy and will not treat for insomnia or refill doxepin, feel therapy would be better for her health instead of more medication. Also on xanax, these are all sedating meds and may cause morbidity or mortality if taken together. - Continue Topiramate, botox, lyrica, prevention. Startr Nurtec preventative.a -Continue maxalt acutely, may take zofran for nausea or migraine - She is seeing a cardiologist, treating HTN may also help with migraines, encouraged her to do so.    Orders Placed This Encounter  Procedures   Ambulatory referral to Neuropsychology    Meds ordered this encounter  Medications   Rimegepant Sulfate (NURTEC) 75 MG TBDP    Sig: Take 75 mg by mouth every other day. For migraines.    Dispense:  16 tablet    Refill:  11   Erenumab-aooe  (AIMOVIG) 140 MG/ML SOAJ    Sig: Inject 140 mg into the skin every 30 (thirty) days.    Dispense:  1 mL    Refill:  11   UBRELVY 100 MG TABS    Sig: Take 100 mg by mouth daily.    Dispense:  16 tablet    Refill:  11     Cc: Adrienne Mocha, PA,  Wilfrid Lund, [place]  Naomie Dean, MD  [facility] [address] [place], [place] [address]  Phone [phone] Fax [phone]  I spent over 75 minutes of face-to-face and  non-face-to-face time with patient on the  1. Chronic migraine without aura, with intractable migraine, so stated, with status migrainosus   2. Attention deficit hyperactivity disorder (ADHD), combined type   3. Psychophysiological insomnia   4. Migraine without aura and with status migrainosus, not intractable   5. Cognitive complaints     diagnosis.  This included previsit extensive chart review, lab review, study review, order entry, electronic health record documentation, patient education on the different diagnostic and therapeutic options, counseling and coordination of care, risks and benefits of management, compliance, or risk factor reduction

## 2021-02-20 NOTE — Telephone Encounter (Signed)
Contacted patient and offered 06/12/21 at 2:00 with Dr. Lucia Gaskins. Also asked patient to let [facility] know about this transfer of care so that they can cancel any Botox authorizations they may have.

## 2021-02-25 ENCOUNTER — Ambulatory Visit: Payer: Managed Care, Other (non HMO) | Admitting: Nurse Practitioner

## 2021-02-25 ENCOUNTER — Telehealth: Payer: Managed Care, Other (non HMO) | Admitting: Nurse Practitioner

## 2021-03-07 ENCOUNTER — Encounter: Payer: Self-pay | Admitting: Nurse Practitioner

## 2021-03-07 ENCOUNTER — Other Ambulatory Visit: Payer: Self-pay

## 2021-03-07 ENCOUNTER — Ambulatory Visit (INDEPENDENT_AMBULATORY_CARE_PROVIDER_SITE_OTHER): Payer: Managed Care, Other (non HMO) | Admitting: Nurse Practitioner

## 2021-03-07 DIAGNOSIS — R0602 Shortness of breath: Secondary | ICD-10-CM | POA: Diagnosis not present

## 2021-03-07 DIAGNOSIS — R413 Other amnesia: Secondary | ICD-10-CM

## 2021-03-07 DIAGNOSIS — Z8616 Personal history of COVID-19: Secondary | ICD-10-CM | POA: Diagnosis not present

## 2021-03-07 DIAGNOSIS — R5381 Other malaise: Secondary | ICD-10-CM

## 2021-03-07 NOTE — Patient Instructions (Addendum)
History of Covid 19 Fatigue Shortness of breath Memory and concentration problems:   Stay well hydrated  Stay active  Deep breathing exercises  May take tylenol for fever or pain  Will order chest x ray:  [facility] [address] [place], [place] [address] [phone] MON - FRI 8:00 AM - 4:00 PM - WALK IN  Will order speech therapy for cognitive rehabilitation   Will order PT for physical deconditioning   Follow up:  Follow up in 3 months

## 2021-03-07 NOTE — Progress Notes (Signed)
Virtual Visit via Telephone Note  I connected with Lanee Chain on 03/07/21 at  1:00 PM EDT by telephone and verified that I am speaking with the correct person using two identifiers.  Location: Patient: home Provider: office   I discussed the limitations, risks, security and privacy concerns of performing an evaluation and management service by telephone and the availability of in person appointments. I also discussed with the patient that there may be a patient responsible charge related to this service. The patient expressed understanding and agreed to proceed.   History of Present Illness:  Patient presents today for post-COVID care clinic visit through telephone visit.  Patient states that she tested positive for COVID in November 2020.  She states that she did recover at home but was very sick for 2 months.  She states that she continues to have ongoing severe fatigue, fluctuating blood pressure, concentration issues, memory issues, shortness of breath with exertion.  Patient has tried a couple different inhalers but could not tolerate them.  She has not had any imaging done.  We discussed that we will order x-ray today.  She does follow with cardiology for blood pressure management. Denies f/c/s, n/v/d, hemoptysis, PND, chest pain or edema.     Observations/Objective:  Vitals with BMI 02/19/2021 12/17/2020 12/14/2020  Height 5' 4.5" 5' 3.5" -  Weight 128 lbs 3 oz 122 lbs -  BMI 21.67 21.27 -  Systolic 129 112 229  Diastolic 86 72 76  Pulse 96 62 70      Assessment and Plan:  History of Covid 19 Fatigue Shortness of breath Memory and concentration problems:   Stay well hydrated  Stay active  Deep breathing exercises  May take tylenol for fever or pain  Will order chest x ray:  [facility] [address] [place], [place] [address] [phone] MON - FRI 8:00 AM - 4:00 PM - WALK IN  Will order speech therapy for cognitive rehabilitation   Will order PT for  physical deconditioning   Follow up:  Follow up in 3 months      I discussed the assessment and treatment plan with the patient. The patient was provided an opportunity to ask questions and all were answered. The patient agreed with the plan and demonstrated an understanding of the instructions.   The patient was advised to call back or seek an in-person evaluation if the symptoms worsen or if the condition fails to improve as anticipated.  I provided 23 minutes of non-face-to-face time during this encounter.   Ivonne Andrew, NP

## 2021-03-08 ENCOUNTER — Ambulatory Visit (HOSPITAL_BASED_OUTPATIENT_CLINIC_OR_DEPARTMENT_OTHER)
Admission: RE | Admit: 2021-03-08 | Discharge: 2021-03-08 | Disposition: A | Discharge status: Home / Self Care | Payer: Managed Care, Other (non HMO) | Source: Ambulatory Visit | Attending: Nurse Practitioner | Admitting: Nurse Practitioner

## 2021-03-08 ENCOUNTER — Other Ambulatory Visit: Payer: Self-pay

## 2021-03-08 DIAGNOSIS — Z8616 Personal history of COVID-19: Secondary | ICD-10-CM | POA: Diagnosis not present

## 2021-03-08 DIAGNOSIS — R0602 Shortness of breath: Secondary | ICD-10-CM | POA: Insufficient documentation

## 2021-03-14 ENCOUNTER — Ambulatory Visit: Payer: Managed Care, Other (non HMO) | Admitting: Podiatry

## 2021-03-22 ENCOUNTER — Other Ambulatory Visit: Payer: Self-pay | Admitting: Cardiology

## 2021-03-26 ENCOUNTER — Encounter: Payer: Self-pay | Admitting: Physical Therapy

## 2021-03-26 ENCOUNTER — Encounter: Payer: Self-pay | Admitting: Speech Pathology

## 2021-03-26 ENCOUNTER — Ambulatory Visit: Payer: Managed Care, Other (non HMO) | Admitting: Speech Pathology

## 2021-03-26 ENCOUNTER — Other Ambulatory Visit: Payer: Self-pay

## 2021-03-26 ENCOUNTER — Ambulatory Visit: Payer: Managed Care, Other (non HMO) | Attending: Nurse Practitioner | Admitting: Physical Therapy

## 2021-03-26 DIAGNOSIS — Z9181 History of falling: Secondary | ICD-10-CM | POA: Diagnosis present

## 2021-03-26 DIAGNOSIS — R41841 Cognitive communication deficit: Secondary | ICD-10-CM

## 2021-03-26 DIAGNOSIS — R0602 Shortness of breath: Secondary | ICD-10-CM | POA: Insufficient documentation

## 2021-03-26 DIAGNOSIS — Z8616 Personal history of COVID-19: Secondary | ICD-10-CM | POA: Diagnosis not present

## 2021-03-26 DIAGNOSIS — R5381 Other malaise: Secondary | ICD-10-CM | POA: Insufficient documentation

## 2021-03-26 DIAGNOSIS — R278 Other lack of coordination: Secondary | ICD-10-CM | POA: Insufficient documentation

## 2021-03-26 DIAGNOSIS — M6281 Muscle weakness (generalized): Secondary | ICD-10-CM | POA: Insufficient documentation

## 2021-03-26 DIAGNOSIS — R2681 Unsteadiness on feet: Secondary | ICD-10-CM | POA: Diagnosis present

## 2021-03-26 NOTE — Addendum Note (Signed)
Addended by: Milinda Pointer on: 03/26/2021 05:34 PM   Modules accepted: Orders

## 2021-03-26 NOTE — Therapy (Signed)
[place] [place], [place], [address] Phone: [phone]   Fax:  [phone]  Physical Therapy Evaluation  Patient Details  Name: Jennifer Reed MRN: [id number] Date of Birth: March 20, 1966 Referring Provider (PT): Lazaro Arms   Encounter Date: 03/26/2021   PT End of Session - 03/26/21 1714     Visit Number 1    Number of Visits 17    Date for PT Re-Evaluation 04/23/21    Authorization Type [employer]    Authorization Time Period 03/26/21 to 05/21/20    PT Start Time 1400    PT Stop Time 1444    PT Time Calculation (min) 44 min    Activity Tolerance Patient tolerated treatment well    Behavior During Therapy [facility] for tasks assessed/performed             Past Medical History:  Diagnosis Date   Acute confusional migraine, refractory 07/21/2014   Allergic rhinitis 10/16/2001   Rhinitis Allergic  NOS Rhinitis Allergic  NOS  Formatting of this note might be different from the original. Overview:  Rhinitis Allergic  NOS   Allergies    Anxiety 05/07/2020   Chronic migraine without aura, with intractable migraine, so stated, with status migrainosus 03/07/2020   Cognitive complaints 05/14/2018   COVID-19 long hauler manifesting chronic concentration deficit 06/04/2020   Dry eye syndrome    Encounter for screening for cardiovascular disorders 03/11/2009   Essential hypertension 10/08/2006   (Problem list name updated by automated process. Provider to review and confirm.) (Problem list name updated by automated process. Provider to review and confirm.)  Formatting of this note might be different from the original. Overview:  (Problem list name updated by automated process. Provider to review and confirm.)   Excessive daytime sleepiness 06/04/2020   Fibromyalgia    Gastritis    Gastroesophageal reflux disease without esophagitis 04/13/2018   GERD (gastroesophageal reflux disease)    Headache 04/28/2013   Problem list  name updated by automated process. Provider to review Problem list name updated by automated process. Provider to review  Formatting of this note might be different from the original. Overview:  Problem list name updated by automated process. Provider to review   Hemorrhoids 10/13/2018   High blood pressure    History of colon polyps 10/13/2018   IBS (irritable bowel syndrome)    Ingrown toenail 01/20/2020   Intractable migraine 04/28/2013   Formatting of this note might be different from the original. Overview:  Problem list name updated by automated process. Provider to review  Formatting of this note might be different from the original. Overview:  updating diagnosis code for icd10 cutover   Ischial bursitis of left side 07/01/2017   Added automatically from request for surgery [id number]   Low back pain 04/28/2016   Lumbar radiculopathy 04/28/2016   Migraine 10/16/2001   Problem list name updated by automated process. Provider to review Problem list name updated by automated process. Provider to review  updating diagnosis code for icd10 cutover updating diagnosis code for icd10 cutover  updating diagnosis code for icd10 cutover updating diagnosis code for icd10 cutover  Migraine Without Aura Migraine Without Aura  Formatting of this note might be different from th   Migraine without aura, not refractory 07/29/2013   Formatting of this note might be different from the original. Overview:  updating diagnosis code for icd10 cutover   Migraines    Mild major depression ([place]) 10/16/2009   Moderate recurrent major  depression ([place]) 05/07/2020   Morning headache 08/02/2020   Raynaud's disease    Raynaud's phenomenon 10/08/2006   (Problem list name updated by automated process. Provider to review and confirm.) (Problem list name updated by automated process. Provider to review and confirm.)   Raynaud's syndrome without gangrene 10/08/2006   Formatting of this note might be different from the original.  Overview:  (Problem list name updated by automated process. 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    Subjective Assessment - 03/26/21 1358     Subjective I had a very bad case of Covid in November 2020; I never went to the hospital but kept in touch with my PCP very closely. I am very forgetful, my STM has not come back. I'm also having a lot of fatigue. I love to walk, its my preferred exercise but I get too SOB and don't have the energy. The fatigue just kills me, I never had chronic fatigue before Covid. My balance is not good, I just tend to fall. I feel like my ears have not been right since having covid, I always feel like my ears are stopped up and like I need to pop them, I'm questioning on if my inner ear might have been involved. The worst things are my short term memories, attention, and decision making cognitively. My life has changed 100% from having Covid.    Pertinent History hx Covid 2020, scleroderma, fibro, HTN    Patient Stated Goals feel better, get up at 8-9am and feel energized, be able to do more physically, be able to shop and handle things on my own    Currently in Pain?  No/denies                [facility] PT Assessment - 03/26/21 0001       Assessment   Medical Diagnosis hx of Covid/physical deconditioning    Referring Provider (PT) Lazaro Arms    Onset Date/Surgical Date --   November 2020   Next MD Visit unsure/PRN    Prior Therapy PT in the past for my neck      Precautions   Precautions Other (comment)    Precaution Comments hx covid- watch sats/HR; had carpal tunnel surgery, can't pick up or put more than 5# on R wrist      Restrictions   Weight Bearing Restrictions No      Balance Screen   Has the patient fallen in the past 6 months Yes    How many times? 2- not sure exactly what happened    Has the patient had a decrease in activity level because of a fear of falling?  No  Is the patient reluctant to leave their home because of a fear of falling?  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    Leisure volunteering, taking care of mother in law      Cognition   Attention Alternating    Memory Impaired    Behaviors Restless      Observation/Other Assessments   Observations BP 122/80 O2 98% HR 90 at rest; central vestibular signs negative, no vertigo noted today      ROM / Strength   AROM / PROM / Strength Strength      Strength   Strength Assessment Site Hip;Knee;Ankle    Right/Left Hip Right;Left    Right Hip Flexion 4-/5    Right Hip Extension 3+/5    Right Hip ABduction 4+/5    Left Hip Flexion 4+/5    Left Hip Extension 3+/5    Left Hip ABduction 4+/5    Right/Left Knee Right;Left    Right Knee Flexion 5/5    Right Knee Extension 5/5    Left Knee Flexion 5/5    Left Knee Extension 5/5    Right/Left Ankle Right;Left    Right Ankle Dorsiflexion 5/5    Left Ankle Dorsiflexion 5/5      6 minute walk test results    Endurance additional comments able to perform 3MWT without difficult, VSS      Standardized  Balance Assessment   Standardized Balance Assessment Berg Balance Test;Dynamic Gait Index      Dynamic Gait Index   Level Surface Normal    Change in Gait Speed Mild Impairment    Gait with Horizontal Head Turns Mild Impairment    Gait with Vertical Head Turns Moderate Impairment    Gait and Pivot Turn Moderate Impairment    Step Over Obstacle Mild Impairment    Step Around Obstacles Mild Impairment    Steps Mild Impairment    Total Score 15                        Objective measurements completed on examination: See above findings.                PT Education - 03/26/21 1713     Education Details PT findings and benefits of therapy, POC moving forward,long disussion regarding general education regarding long Covid presentation, symptoms, PT management strategies    Person(s) Educated Patient    Methods Explanation    Comprehension Verbalized understanding;Need further instruction              PT Short Term Goals - 03/26/21 1720       PT SHORT TERM GOAL #1   Title Will be compliant with appropriate HEP to be progressed PRN    Time 4    Period Weeks    Status New    Target Date 04/23/21      PT SHORT TERM GOAL #2   Title Will be able to carry at least 10# 162ft through obstacle course in clinic without unsteadiness to show reduced fall risk while multitasking    Time 4    Period Weeks    Status New      PT SHORT TERM GOAL #3   Title Will be able to perform at least 20 minutes of low-moderate intensity activity involving lifting/carrying in order to show improved functional activity  tolerance with RPE no higher than 5/10    Time 4    Period Weeks    Status New      PT SHORT TERM GOAL #4   Title Will be able to verbalize at least 3 strategies to reduce fall risk and 3 strategies to promote energy conservation at home and in community    Time 4    Period Weeks    Status New               PT Long Term Goals - 03/26/21 1725        PT LONG TERM GOAL #1   Title MMT scores to improve by at least 1 grade in all weak muscle groups in order to show improvement in functional strength    Time 8    Period Weeks    Status New    Target Date 05/21/21      PT LONG TERM GOAL #2   Title Will be able to score at least 20/24 on DGI in order to show reduced fall risk    Time 8    Period Weeks    Status New      PT LONG TERM GOAL #3   Title Will be able to tolerate 45 minutes of moderate level  functional activity with no more than one rest break and RPE no more than 5/10 to show improved functional activity tolerance    Time 8    Period Weeks    Status New      PT LONG TERM GOAL #4   Title Will be able to perform two major daily activities (for example, grocery shopping AND going to a doctor's appointment) without increased fatigue    Baseline only able to perform one and very fatigued the rest of the day    Time 8    Period Weeks    Status New                    Plan - 03/26/21 1715     Clinical Impression Statement Jennifer Reed arrives today reporting a two year history of long Covid symptoms including severe fatigue, brain fog/memory issues, balance problems with multiple falls, and double vision with dizziness. She was sick with covid for at least two months in November 2020, did not go to the hospital at the time and was largely bed bound. Examination reveals functional strength impairment, as well as significant impairment in functional balance and balance recovery skills, impaired energy conservation strategies and safety awareness, and possible Covid related vertigo (will require further examination/assessment). Also tends to struggle with STM and attention to task, which likely further heightens fall risk. Vital signs stable during today's session on room air, Spo2 97% at lowest and BP/HR WNL. Will benefit from skilled PT services to address functional impairments, reduce fall risk, and improve energy conservation  strategies.    Personal Factors and Comorbidities Past/Current Experience;Comorbidity 3+;Time since onset of injury/illness/exacerbation    Comorbidities scleroderma, fibromyalgia, HTN, Raynaud's, lumbar radiculopathy, MDD, hx Covid, reactive airway syndrome    Examination-Activity Limitations Locomotion Level;Carry;Stairs;Lift;Other    Examination-Participation Restrictions Art gallery manager;Yard Work;Volunteer;Shop;Driving    Stability/Clinical Decision Making Evolving/Moderate complexity    Clinical Decision Making Moderate    Rehab Potential Good    PT Frequency 2x / week    PT Duration 8 weeks    PT Treatment/Interventions ADLs/Self Care Home Management;Cryotherapy;Electrical Stimulation;Iontophoresis 4mg /ml Dexamethasone;Moist Heat;Ultrasound;Gait training;Stair training;Functional mobility training;Therapeutic activities;Therapeutic exercise;Balance training;Neuromuscular re-education;Cognitive remediation;Patient/family education;Manual techniques;Energy  conservation;Taping    PT Next Visit Plan assign initial HEP; focus on endurance training and energy conservation strategies, balance, functional strength, dual task challenges (make sure speech not working on this the same day)    Consulted and Agree with Plan of Care Patient             Patient will benefit from skilled therapeutic intervention in order to improve the following deficits and impairments:  Decreased coordination, Decreased endurance, Cardiopulmonary status limiting activity, Decreased safety awareness, Dizziness, Decreased activity tolerance, Decreased balance, Decreased strength  Visit Diagnosis: Muscle weakness (generalized)  Unsteadiness on feet  History of falling  Other lack of coordination     Problem List Patient Active Problem List   Diagnosis Date Noted   Attention deficit hyperactivity disorder (ADHD), combined type 02/19/2021   Psychophysiological insomnia 02/19/2021   Abdominal  bloating 12/10/2020   Microscopic colitis 12/10/2020   Migraines    IBS (irritable bowel syndrome)    GERD (gastroesophageal reflux disease)    Dry eye syndrome    High blood pressure    Allergies    Morning headache 08/02/2020   Excessive daytime sleepiness 06/04/2020   COVID-19 long hauler manifesting chronic concentration deficit 06/04/2020   Severe obstructive sleep apnea-hypopnea syndrome 06/04/2020   Anxiety 05/07/2020   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   Rosacea 05/07/2020   Chronic migraine without aura, with intractable migraine, so stated, with status migrainosus 03/07/2020   Raynaud's disease    Ingrown toenail 01/20/2020   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   Hemorrhoids 10/13/2018   History of colon polyps 10/13/2018   Cognitive complaints 05/14/2018   Other insomnia 05/14/2018   Reactive airway disease 04/13/2018   Scleroderma (HCC) 04/13/2018   Fibromyalgia 04/13/2018   Gastroesophageal reflux disease without esophagitis 04/13/2018   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 10/28/2017   Renal cyst 08/18/2017   Spondylosis of lumbar region without myelopathy or radiculopathy 07/17/2017   Ischial bursitis of left side 07/01/2017   Sacroiliitis (HCC) 06/05/2017   Irritable bowel syndrome with both constipation and diarrhea 04/17/2017   Thrombocytopenia (H[facility]) 04/17/2017   Low back pain 04/28/2016   Lumbar radiculopathy 04/28/2016   Acute confusional migraine, refractory 07/21/2014   Migraine without aura, not refractory 07/29/2013   Headache 04/28/2013   Intractable migraine 04/28/2013   Spondylolisthesis at L5-S1 level 11/29/2012   Mild major depression (H[facility]) 10/16/2009   Encounter for screening for cardiovascular disorders 03/11/2009   Essential hypertension 10/08/2006   Raynaud's phenomenon 10/08/2006   Raynaud's syndrome without gangrene 10/08/2006   Allergic rhinitis 10/16/2001   Migraine 10/16/2001   Lerry Liner PT, DPT, PN2    Supplemental Physical Therapist [facility]    Pager [phone] Acute Rehab Office [phone]   [facility] [address] [place]. [place], [place], [address] Phone: [phone]   Fax:  [phone]  Name: Jennifer Reed MRN: [id number] Date of Birth: May 20, 1965

## 2021-03-27 ENCOUNTER — Encounter: Payer: Self-pay | Admitting: Speech Pathology

## 2021-03-27 NOTE — Therapy (Signed)
[facility]. [place], [place], [address] Phone: [phone]   Fax:  [phone]  Speech Language Pathology Evaluation  Patient Details  Name: Jennifer Reed MRN: [id number] Date of Birth: [birth year] Referring Provider (SLP): Fenton Foy NP   Encounter Date: 03/26/2021   End of Session - 03/27/21 0905     Visit Number 1    Number of Visits 17    Date for SLP Re-Evaluation 05/27/21    SLP Start Time 1445    SLP Stop Time  1530    SLP Time Calculation (min) 45 min    Activity Tolerance Patient tolerated treatment well             Past Medical History:  Diagnosis Date   Acute confusional migraine, refractory 07/21/2014   Allergic rhinitis 10/16/2001   Rhinitis Allergic  NOS Rhinitis Allergic  NOS  Formatting of this note might be different from the original. Overview:  Rhinitis Allergic  NOS   Allergies    Anxiety 05/07/2020   Chronic migraine without aura, with intractable migraine, so stated, with status migrainosus 03/07/2020   Cognitive complaints 05/14/2018   COVID-19 long hauler manifesting chronic concentration deficit 06/04/2020   Dry eye syndrome    Encounter for screening for cardiovascular disorders 03/11/2009   Essential hypertension 10/08/2006   (Problem list name updated by automated process. Provider to review and confirm.) (Problem list name updated by automated process. Provider to review and confirm.)  Formatting of this note might be different from the original. Overview:  (Problem list name updated by automated process. Provider to review and confirm.)   Excessive daytime sleepiness 06/04/2020   Fibromyalgia    Gastritis    Gastroesophageal reflux disease without esophagitis 04/13/2018   GERD (gastroesophageal reflux disease)    Headache 04/28/2013   Problem list name updated by automated process. Provider to review Problem list name updated by automated process. Provider to review   Formatting of this note might be different from the original. Overview:  Problem list name updated by automated process. Provider to review   Hemorrhoids 10/13/2018   High blood pressure    History of colon polyps 10/13/2018   IBS (irritable bowel syndrome)    Ingrown toenail 01/20/2020   Intractable migraine 04/28/2013   Formatting of this note might be different from the original. Overview:  Problem list name updated by automated process. Provider to review  Formatting of this note might be different from the original. Overview:  updating diagnosis code for icd10 cutover   Ischial bursitis of left side 07/01/2017   Added automatically from request for surgery [id number]   Low back pain 04/28/2016   Lumbar radiculopathy 04/28/2016   Migraine 10/16/2001   Problem list name updated by automated process. Provider to review Problem list name updated by automated process. Provider to review  updating diagnosis code for icd10 cutover updating diagnosis code for icd10 cutover  updating diagnosis code for icd10 cutover updating diagnosis code for icd10 cutover  Migraine Without Aura Migraine Without Aura  Formatting of this note might be different from th   Migraine without aura, not refractory 07/29/2013   Formatting of this note might be different from the original. Overview:  updating diagnosis code for icd10 cutover   Migraines    Mild major depression ([place]) 10/16/2009   Moderate recurrent major depression ([place]) 05/07/2020   Morning headache 08/02/2020   Raynaud's disease    Raynaud's phenomenon 10/08/2006   (Problem list  name updated by automated process. Provider to review and confirm.) (Problem list name updated by automated process. Provider to review and confirm.)   Raynaud's syndrome without gangrene 10/08/2006   Formatting of this note might be different from the original. Overview:  (Problem list name updated by automated process. 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   Subjective Assessment - 03/26/21 1448     Subjective Pt was pleasant and cooperative throughout assessment.    Currently in Pain? Yes    Pain Score 4     Pain Location Neck    Pain Orientation Right    Pain Descriptors / Indicators Dull;Throbbing    Pain Type Chronic pain    Pain Onset More than a month ago    Pain Frequency Constant    Aggravating Factors  sitting; laying    Pain Relieving Factors No medication                SLP Evaluation [facility] - 03/26/21 1448       SLP Visit Information   SLP Received On 03/26/21    Referring Provider (SLP) Fenton Foy NP    Onset Date Nov 2020    Medical Diagnosis Post -Covid      Subjective   Patient/Family Stated Goal "To remember more things"      General Information   HPI Pt is a 55 yo female, post-COVID care clinic visit through telephone visit.  Patient had a positive test for  COVID in November 2020.  Reportedly, she did recover at home but was very sick for 2 months.  Reports ongoing severe fatigue, fluctuating blood pressure, concentration issues, memory issues, shortness of breath with exertion.  Patient has tried a couple different  inhalers but could not tolerate them.  Covid care clinic ordered xray. She does follow with cardiology for blood pressure management. Denies f/c/s, n/v/d, hemoptysis, PND, chest pain or edema.      Balance Screen   Has the patient fallen in the past 6 months Yes    How many times? 2    Has the patient had a decrease in activity level because of a fear of falling?  Yes    Is the patient reluctant to leave their home because of a fear of falling?  No      Prior Functional Status   Cognitive/Linguistic Baseline Within functional limits    Type of Home House     Lives With Tonye Pearson   Available Support Family;Friend(s)    Veterinary surgeon; 2 years    Vocation Unemployed      Cognition   Overall Cognitive Status Impaired/Different from baseline  Area of Impairment Attention;Memory;Safety/judgement    Memory Decreased short-term memory    Memory Impaired    Memory Impairment Decreased recall of new information;Decreased short term memory;Retrieval deficit      Auditory Comprehension   Overall Auditory Comprehension Appears within functional limits for tasks assessed    Interfering Components Attention    EffectiveTechniques Extra processing time;Repetition      Verbal Expression   Overall Verbal Expression Appears within functional limits for tasks assessed    Other Verbal Expression Comments Some higher-level word finding deficits noted; however, SLP suspects that these are due to attention/processing vs. language impairment.      Oral Motor/Sensory Function   Overall Oral Motor/Sensory Function Appears within functional limits for tasks assessed      Motor Speech   Overall Motor Speech Appears within functional limits for tasks assessed      Standardized Assessments   Standardized Assessments  Cognitive Linguistic Quick Test;Other Assessment    Other Assessment Unable to complete CLQT this date. Will cont next session.                             SLP  Education - 03/27/21 0904     Education Details Provided edu on cog-comm impairment.    Person(s) Educated Patient    Methods Explanation;Demonstration    Comprehension Verbalized understanding;Need further instruction;Verbal cues required              SLP Short Term Goals - 03/27/21 1036       SLP SHORT TERM GOAL #1   Title Pt will increase auditory comprehension by recalling and verbalizing strategies to assist with active listening during conversation with minA.    Time 4    Period Weeks    Status New    Target Date 04/24/21      SLP SHORT TERM GOAL #2   Title Pt will comprehend functional memory or visual aids for recall of important information during structured conversations with minA.    Time 4    Period Weeks    Status New    Target Date 04/24/21              SLP Long Term Goals - 03/27/21 1032       SLP LONG TERM GOAL #1   Title Pt will increase auditory comprehension by recalling and verbalizing strategies to assist with active listening during unstructured conversation independently.    Time 8    Period Weeks    Status New    Target Date 05/22/21      SLP LONG TERM GOAL #2   Title Pt will comprehend functional memory or visual aids for recall of important information during unstructured conversations independently.    Time 8    Period Weeks    Status New    Target Date 05/22/21              Plan - 03/27/21 0917     Clinical Impression Statement Pt is a 55 yo female who presents for OP ST evaluation. Pt was referred for memory loss post covid in Nov 2020. Pt came prepared with a list from her husband regarding her difficulty she is facing at home re: fatigue, memory, preparedness, timliness, and decision making. She reported that her family "gets upset" if she forgets things they have told her and that she has become more depressed due to her impairments. She became emotional when talking about how  these new impairments have been impacting her  life and relationships She stated she has always enjoyed volunteering, but is unable to participate due to her high levels of fatigue. SLP began assessing pt using the CLQT. Pt was anxious about completing evaluation and required encouragment to continue. Pt scored a 7/9 on generative naming and a 10/10 on the clock drawing. Throughout case history and assessment, pt exhibited poor inhibition, attention, and memory during conversation which impacts her comprehension of information. She required several repetitions of directions and information for recall. SLP to complete CLQT next session and questionnaire next session. Based upon pt report of negative feelings and SLP observation, SLP rec skilled ST to address cognitive-communication impairment to increase pt's confidence and maximize functional independence.    Speech Therapy Frequency 2x / week    Duration 8 weeks    Treatment/Interventions Compensatory strategies;Cueing hierarchy;Functional tasks;Patient/family education;Environmental controls;Cognitive reorganization;Multimodal communcation approach;Compensatory techniques;Internal/external aids;SLP instruction and feedback    Potential to Achieve Goals Good    Consulted and Agree with Plan of Care Patient             Patient will benefit from skilled therapeutic intervention in order to improve the following deficits and impairments:   Cognitive communication deficit - Plan: SLP plan of care cert/re-cert    Problem List Patient Active Problem List   Diagnosis Date Noted   Attention deficit hyperactivity disorder (ADHD), combined type 02/19/2021   Psychophysiological insomnia 02/19/2021   Abdominal bloating 12/10/2020   Microscopic colitis 12/10/2020   Migraines    IBS (irritable bowel syndrome)    GERD (gastroesophageal reflux disease)    Dry eye syndrome    High blood pressure    Allergies    Morning headache 08/02/2020   Excessive daytime sleepiness 06/04/2020   COVID-19 long  hauler manifesting chronic concentration deficit 06/04/2020   Severe obstructive sleep apnea-hypopnea syndrome 06/04/2020   Anxiety 05/07/2020   Moderate recurrent major depression ([place]) 05/07/2020   Rosacea 05/07/2020   Chronic migraine without aura, with intractable migraine, so stated, with status migrainosus 03/07/2020   Raynaud's disease    Ingrown toenail 01/20/2020   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   Hemorrhoids 10/13/2018   History of colon polyps 10/13/2018   Cognitive complaints 05/14/2018   Other insomnia 05/14/2018   Reactive airway disease 04/13/2018   Scleroderma ([place]) 04/13/2018   Fibromyalgia 04/13/2018   Gastroesophageal reflux disease without esophagitis 04/13/2018   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 10/28/2017   Renal cyst 08/18/2017   Spondylosis of lumbar region without myelopathy or radiculopathy 07/17/2017   Ischial bursitis of left side 07/01/2017   Sacroiliitis ([place]) 06/05/2017   Irritable bowel syndrome with both constipation and diarrhea 04/17/2017   Thrombocytopenia ([place]) 04/17/2017   Low back pain 04/28/2016   Lumbar radiculopathy 04/28/2016   Acute confusional migraine, refractory 07/21/2014   Migraine without aura, not refractory 07/29/2013   Headache 04/28/2013   Intractable migraine 04/28/2013   Spondylolisthesis at L5-S1 level 11/29/2012   Mild major depression ([place]) 10/16/2009   Encounter for screening for cardiovascular disorders 03/11/2009   Essential hypertension 10/08/2006   Raynaud's phenomenon 10/08/2006   Raynaud's syndrome without gangrene 10/08/2006   Allergic rhinitis 10/16/2001   Migraine 10/16/2001    Rosann Auerbach [place] MS, [place], CBIS  03/27/2021, 1:51 PM  [place]. [place], [place], [address] Phone: [phone]   Fax:  [phone]  Name: Nikkia Ruotolo MRN: [id number] Date of Birth: 12-25-1965

## 2021-03-28 ENCOUNTER — Ambulatory Visit: Payer: Managed Care, Other (non HMO) | Admitting: Physical Therapy

## 2021-03-28 ENCOUNTER — Encounter: Payer: Self-pay | Admitting: Speech Pathology

## 2021-03-28 ENCOUNTER — Ambulatory Visit: Payer: Managed Care, Other (non HMO) | Admitting: Speech Pathology

## 2021-03-28 ENCOUNTER — Other Ambulatory Visit: Payer: Self-pay

## 2021-03-28 DIAGNOSIS — M6281 Muscle weakness (generalized): Secondary | ICD-10-CM | POA: Diagnosis not present

## 2021-03-28 DIAGNOSIS — Z9181 History of falling: Secondary | ICD-10-CM

## 2021-03-28 DIAGNOSIS — R2681 Unsteadiness on feet: Secondary | ICD-10-CM

## 2021-03-28 DIAGNOSIS — R41841 Cognitive communication deficit: Secondary | ICD-10-CM

## 2021-03-28 NOTE — Therapy (Signed)
[place]. [place], [place], [address] Phone: [phone]   Fax:  [phone]  Speech Language Pathology Treatment  Patient Details  Name: Jennifer Reed MRN: [id number] Date of Birth: 1965-12-05 Referring Provider (SLP): Fenton Foy NP   Encounter Date: 03/28/2021   End of Session - 03/28/21 1455     Visit Number 2    Number of Visits 17    Date for SLP Re-Evaluation 05/27/21    SLP Start Time 1445    SLP Stop Time  1530    SLP Time Calculation (min) 45 min    Activity Tolerance Patient tolerated treatment well             Past Medical History:  Diagnosis Date   Acute confusional migraine, refractory 07/21/2014   Allergic rhinitis 10/16/2001   Rhinitis Allergic  NOS Rhinitis Allergic  NOS  Formatting of this note might be different from the original. Overview:  Rhinitis Allergic  NOS   Allergies    Anxiety 05/07/2020   Chronic migraine without aura, with intractable migraine, so stated, with status migrainosus 03/07/2020   Cognitive complaints 05/14/2018   COVID-19 long hauler manifesting chronic concentration deficit 06/04/2020   Dry eye syndrome    Encounter for screening for cardiovascular disorders 03/11/2009   Essential hypertension 10/08/2006   (Problem list name updated by automated process. Provider to review and confirm.) (Problem list name updated by automated process. Provider to review and confirm.)  Formatting of this note might be different from the original. Overview:  (Problem list name updated by automated process. Provider to review and confirm.)   Excessive daytime sleepiness 06/04/2020   Fibromyalgia    Gastritis    Gastroesophageal reflux disease without esophagitis 04/13/2018   GERD (gastroesophageal reflux disease)    Headache 04/28/2013   Problem list name updated by automated process. Provider to review Problem list name updated by automated process. Provider to review   Formatting of this note might be different from the original. Overview:  Problem list name updated by automated process. Provider to review   Hemorrhoids 10/13/2018   High blood pressure    History of colon polyps 10/13/2018   IBS (irritable bowel syndrome)    Ingrown toenail 01/20/2020   Intractable migraine 04/28/2013   Formatting of this note might be different from the original. Overview:  Problem list name updated by automated process. Provider to review  Formatting of this note might be different from the original. Overview:  updating diagnosis code for icd10 cutover   Ischial bursitis of left side 07/01/2017   Added automatically from request for surgery [id number]   Low back pain 04/28/2016   Lumbar radiculopathy 04/28/2016   Migraine 10/16/2001   Problem list name updated by automated process. Provider to review Problem list name updated by automated process. Provider to review  updating diagnosis code for icd10 cutover updating diagnosis code for icd10 cutover  updating diagnosis code for icd10 cutover updating diagnosis code for icd10 cutover  Migraine Without Aura Migraine Without Aura  Formatting of this note might be different from th   Migraine without aura, not refractory 07/29/2013   Formatting of this note might be different from the original. Overview:  updating diagnosis code for icd10 cutover   Migraines    Mild major depression ([place]) 10/16/2009   Moderate recurrent major depression ([place]) 05/07/2020   Morning headache 08/02/2020   Raynaud's disease    Raynaud's phenomenon 10/08/2006   (Problem list  name updated by automated process. Provider to review and confirm.) (Problem list name updated by automated process. Provider to review and confirm.)   Raynaud's syndrome without gangrene 10/08/2006   Formatting of this note might be different from the original. Overview:  (Problem list name updated by automated process. 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   Subjective Assessment - 03/28/21 1450     Subjective "You're not going to test me again are you? That gave me anxiety."    Currently in Pain? No/denies    Pain Onset More than a month ago                   ADULT SLP TREATMENT - 03/28/21 1635       General Information   Behavior/Cognition Alert;Cooperative      Treatment Provided   Treatment provided Cognitive-Linquistic      Cognitive-Linquistic Treatment   Treatment focused on Cognition    Skilled Treatment SLP trained pt in attention skills this session. Pt required redirection to task (around x15 per 50 minute session). Pt would sometimes cue herself to get back on task, which demonstrates increased self-awareness. Discussed "spoon theory" and spoke about analyzing tasks at home to decrease cognitive and physical fatigue. Also discussed communication strategies for loved ones to aid comprehension. Pt reported husband would benefit from attending session.  Pt would benefit from strategies for relaxation. To continue attention strategies or relaxation strategies next session.      Assessment / Recommendations / Plan   Plan Continue with  current plan of care      Progression Toward Goals   Progression toward goals Progressing toward goals                SLP Short Term Goals - 03/27/21 1036       SLP SHORT TERM GOAL #1   Title Pt will increase auditory comprehension by recalling and verbalizing strategies to assist with active listening during conversation with minA.    Time 4    Period Weeks    Status New    Target Date 04/24/21      SLP SHORT TERM GOAL #2   Title Pt will comprehend functional memory or visual aids for recall of important information during structured conversations with minA.    Time 4    Period Weeks    Status New    Target Date 04/24/21              SLP Long Term Goals - 03/27/21 1032       SLP LONG TERM GOAL #1   Title Pt will increase auditory comprehension by recalling and verbalizing strategies  to assist with active listening during unstructured conversation independently.    Time 8    Period Weeks    Status New    Target Date 05/22/21      SLP LONG TERM GOAL #2   Title Pt will comprehend functional memory or visual aids for recall of important information during unstructured conversations independently.    Time 8    Period Weeks    Status New    Target Date 05/22/21              Plan - 03/28/21 1536     Clinical Impression Statement See tx note. Pt is a 55 yo female who presents for OP ST evaluation. Pt was referred for memory loss post covid in Nov 2020. Pt came prepared with a list from her husband regarding her difficulty she is facing at home re: fatigue, memory, preparedness, timliness, and decision making. She reported that her family "gets upset" if she forgets things they have told her and that she has become more depressed due to her impairments. She became emotional when talking about how these new impairments have been impacting her life and relationships She stated she has always enjoyed volunteering, but is unable to participate due to her high levels  of fatigue. SLP began assessing pt using the CLQT. Pt was anxious about completing evaluation and required encouragment to continue. Pt scored a 7/9 on generative naming and a 10/10 on the clock drawing. Throughout case history and assessment, pt exhibited poor inhibition, attention, and memory during conversation which impacts her comprehension of information. She required several repetitions of directions and information for recall. SLP to complete CLQT next session and questionnaire next session. Based upon pt report of negative feelings and SLP observation, SLP rec skilled ST to address cognitive-communication impairment to increase pt's confidence and maximize functional independence.    Speech Therapy Frequency 2x / week    Duration 8 weeks    Treatment/Interventions Compensatory strategies;Cueing hierarchy;Functional tasks;Patient/family education;Environmental controls;Cognitive reorganization;Multimodal communcation approach;Compensatory techniques;Internal/external aids;SLP instruction and feedback    Potential to Achieve Goals Good    Consulted and Agree with Plan of Care Patient             Patient will benefit from skilled therapeutic intervention in order to improve the following deficits and impairments:   Cognitive communication deficit    Problem List Patient Active Problem List   Diagnosis Date Noted   Attention deficit hyperactivity disorder (ADHD), combined type 02/19/2021   Psychophysiological insomnia 02/19/2021   Abdominal bloating 12/10/2020   Microscopic colitis 12/10/2020   Migraines    IBS (irritable bowel syndrome)    GERD (gastroesophageal reflux disease)    Dry eye syndrome    High blood pressure    Allergies    Morning headache 08/02/2020   Excessive daytime sleepiness 06/04/2020   COVID-19 long hauler manifesting chronic concentration deficit 06/04/2020   Severe obstructive sleep apnea-hypopnea syndrome 06/04/2020   Anxiety 05/07/2020   Moderate  recurrent major depression ([place]) 05/07/2020   Rosacea 05/07/2020   Chronic migraine without aura, with intractable migraine, so stated, with status migrainosus 03/07/2020   Raynaud's disease    Ingrown toenail 01/20/2020   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   Hemorrhoids 10/13/2018   History of colon polyps 10/13/2018   Cognitive complaints 05/14/2018   Other insomnia 05/14/2018   Reactive airway disease 04/13/2018   Scleroderma ([place]) 04/13/2018   Fibromyalgia 04/13/2018   Gastroesophageal reflux disease without esophagitis 04/13/2018  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 10/28/2017   Renal cyst 08/18/2017   Spondylosis of lumbar region without myelopathy or radiculopathy 07/17/2017   Ischial bursitis of left side 07/01/2017   Sacroiliitis (HCC) 06/05/2017   Irritable bowel syndrome with both constipation and diarrhea 04/17/2017   Thrombocytopenia (HCC) 04/17/2017   Low back pain 04/28/2016   Lumbar radiculopathy 04/28/2016   Acute confusional migraine, refractory 07/21/2014   Migraine without aura, not refractory 07/29/2013   Headache 04/28/2013   Intractable migraine 04/28/2013   Spondylolisthesis at L5-S1 level 11/29/2012   Mild major depression (HCC) 10/16/2009   Encounter for screening for cardiovascular disorders 03/11/2009   Essential hypertension 10/08/2006   Raynaud's phenomenon 10/08/2006   Raynaud's syndrome without gangrene 10/08/2006   Allergic rhinitis 10/16/2001   Migraine 10/16/2001    Michelene Gardener Mackey MS, [place], CBIS  03/28/2021, 4:47 PM  [facility] [address]. [place], [place], [address] Phone: [phone]   Fax:  [phone]   Name: Jennifer Reed MRN: [id number] Date of Birth: [birth year]

## 2021-03-28 NOTE — Therapy (Signed)
[place]. [place], [place], [address] Phone: [phone]   Fax:  [phone]  Physical Therapy Treatment  Patient Details  Name: Jennifer Reed MRN: [id number] Date of Birth: November 10, 1965 Referring Provider (PT): Lazaro Arms   Encounter Date: 03/28/2021   PT End of Session - 03/28/21 1706     Visit Number 2    Number of Visits 17    Date for PT Re-Evaluation 04/23/21    Authorization Type [employer]    Authorization Time Period 03/26/21 to 05/21/20    PT Start Time 1535    PT Stop Time 1615    PT Time Calculation (min) 40 min             Past Medical History:  Diagnosis Date   Acute confusional migraine, refractory 07/21/2014   Allergic rhinitis 10/16/2001   Rhinitis Allergic  NOS Rhinitis Allergic  NOS  Formatting of this note might be different from the original. Overview:  Rhinitis Allergic  NOS   Allergies    Anxiety 05/07/2020   Chronic migraine without aura, with intractable migraine, so stated, with status migrainosus 03/07/2020   Cognitive complaints 05/14/2018   COVID-19 long hauler manifesting chronic concentration deficit 06/04/2020   Dry eye syndrome    Encounter for screening for cardiovascular disorders 03/11/2009   Essential hypertension 10/08/2006   (Problem list name updated by automated process. Provider to review and confirm.) (Problem list name updated by automated process. Provider to review and confirm.)  Formatting of this note might be different from the original. Overview:  (Problem list name updated by automated process. Provider to review and confirm.)   Excessive daytime sleepiness 06/04/2020   Fibromyalgia    Gastritis    Gastroesophageal reflux disease without esophagitis 04/13/2018   GERD (gastroesophageal reflux disease)    Headache 04/28/2013   Problem list name updated by automated process. Provider to review Problem list name updated by automated process. Provider to  review  Formatting of this note might be different from the original. Overview:  Problem list name updated by automated process. Provider to review   Hemorrhoids 10/13/2018   High blood pressure    History of colon polyps 10/13/2018   IBS (irritable bowel syndrome)    Ingrown toenail 01/20/2020   Intractable migraine 04/28/2013   Formatting of this note might be different from the original. Overview:  Problem list name updated by automated process. Provider to review  Formatting of this note might be different from the original. Overview:  updating diagnosis code for icd10 cutover   Ischial bursitis of left side 07/01/2017   Added automatically from request for surgery [id number]   Low back pain 04/28/2016   Lumbar radiculopathy 04/28/2016   Migraine 10/16/2001   Problem list name updated by automated process. Provider to review Problem list name updated by automated process. Provider to review  updating diagnosis code for icd10 cutover updating diagnosis code for icd10 cutover  updating diagnosis code for icd10 cutover updating diagnosis code for icd10 cutover  Migraine Without Aura Migraine Without Aura  Formatting of this note might be different from th   Migraine without aura, not refractory 07/29/2013   Formatting of this note might be different from the original. Overview:  updating diagnosis code for icd10 cutover   Migraines    Mild major depression ([place]) 10/16/2009   Moderate recurrent major depression ([place]) 05/07/2020   Morning headache 08/02/2020   Raynaud's disease    Raynaud's phenomenon 10/08/2006   (  Problem list name updated by automated process. Provider to review and confirm.) (Problem list name updated by automated process. Provider to review and confirm.)   Raynaud's syndrome without gangrene 10/08/2006   Formatting of this note might be different from the original. Overview:  (Problem list name updated by automated process. Provider to review and confirm.)   Reactive airway  disease 04/13/2018   Renal cyst 08/18/2017   Rosacea 05/07/2020   Sacroiliitis (HCC) 06/05/2017   Added automatically from request for surgery [id number]   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 10/28/2017   Scleroderma (HCC)    Severe obstructive sleep apnea-hypopnea syndrome 06/04/2020   Spondylolisthesis at L5-S1 level 11/29/2012   Spondylosis of lumbar region without myelopathy or radiculopathy 07/17/2017   Added automatically from request for surgery [id number]   Thrombocytopenia (HCC) 04/17/2017   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   Wolff-Parkinson-White syndrome     Past Surgical History:  Procedure Laterality Date   BACK SURGERY     lumbar fusion   CESAREAN SECTION  1999 and 1998   NECK SURGERY     PARTIAL HYSTERECTOMY      There were no vitals filed for this visit.   Subjective Assessment - 03/28/21 1533     Subjective doing okay- just havent done much    Currently in Pain? No/denies                               [facility] Adult PT Treatment/Exercise - 03/28/21 0001       Exercises   Exercises Shoulder;Lumbar;Knee/Hip      Lumbar Exercises: Aerobic   Nustep L5 LE only      Knee/Hip Exercises: Machines for Strengthening   Cybex Knee Extension 5# 2 sets 8    Cybex Knee Flexion 20# 2 sets 10    Cybex Leg Press 20# 2 sets 10      Knee/Hip Exercises: Standing   Walking with Sports Cord 20# fwd/back 5 x , 3 x each side - cuing and CG- min A      Knee/Hip Exercises: Seated   Sit to Sand 20 reps;without UE support   on airex     Shoulder Exercises: ROM/Strengthening   UBE (Upper Arm Bike) L 2 3 min fwd                       PT Short Term Goals - 03/26/21 1720       PT SHORT TERM GOAL #1   Title Will be compliant with appropriate HEP to be progressed PRN    Time 4    Period Weeks    Status New    Target Date 04/23/21      PT SHORT TERM GOAL #2   Title Will be able to carry at least 10# 135ft through obstacle course  in clinic without unsteadiness to show reduced fall risk while multitasking    Time 4    Period Weeks    Status New      PT SHORT TERM GOAL #3   Title Will be able to perform at least 20 minutes of low-moderate intensity activity involving lifting/carrying in order to show improved functional activity tolerance with RPE no higher than 5/10    Time 4    Period Weeks    Status New      PT SHORT TERM GOAL #4   Title Will be able to verbalize at least 3  strategies to reduce fall risk and 3 strategies to promote energy conservation at home and in community    Time 4    Period Weeks    Status New               PT Long Term Goals - 03/26/21 1725       PT LONG TERM GOAL #1   Title MMT scores to improve by at least 1 grade in all weak muscle groups in order to show improvement in functional strength    Time 8    Period Weeks    Status New    Target Date 05/21/21      PT LONG TERM GOAL #2   Title Will be able to score at least 20/24 on DGI in order to show reduced fall risk    Time 8    Period Weeks    Status New      PT LONG TERM GOAL #3   Title Will be able to tolerate 45 minutes of moderate level  functional activity with no more than one rest break and RPE no more than 5/10 to show improved functional activity tolerance    Time 8    Period Weeks    Status New      PT LONG TERM GOAL #4   Title Will be able to perform two major daily activities (for example, grocery shopping AND going to a doctor's appointment) without increased fatigue    Baseline only able to perform one and very fatigued the rest of the day    Time 8    Period Weeks    Status New                   Plan - 03/28/21 1706     Clinical Impression Statement asked pt at start of session re: falls and she denies falls but end of session pt states "slid " down hill earlier. pt tolerated initial ther ex well but needed VCing and atcile cuing to perform correctly - did not issue HEP for this reason.  Pt had no SOB with ex and able to maintain conversation throughout. No LOB during session and pt moved quickly through clinic. Did struggle with resisted gait.    PT Treatment/Interventions ADLs/Self Care Home Management;Cryotherapy;Electrical Stimulation;Iontophoresis 4mg /ml Dexamethasone;Moist Heat;Ultrasound;Gait training;Stair training;Functional mobility training;Therapeutic activities;Therapeutic exercise;Balance training;Neuromuscular re-education;Cognitive remediation;Patient/family education;Manual techniques;Energy conservation;Taping    PT Next Visit Plan assess response to tc today and work towards HEP she can complete correctly             Patient will benefit from skilled therapeutic intervention in order to improve the following deficits and impairments:  Decreased coordination, Decreased endurance, Cardiopulmonary status limiting activity, Decreased safety awareness, Dizziness, Decreased activity tolerance, Decreased balance, Decreased strength  Visit Diagnosis: Muscle weakness (generalized)  Unsteadiness on feet  History of falling     Problem List Patient Active Problem List   Diagnosis Date Noted   Attention deficit hyperactivity disorder (ADHD), combined type 02/19/2021   Psychophysiological insomnia 02/19/2021   Abdominal bloating 12/10/2020   Microscopic colitis 12/10/2020   Migraines    IBS (irritable bowel syndrome)    GERD (gastroesophageal reflux disease)    Dry eye syndrome    High blood pressure    Allergies    Morning headache 08/02/2020   Excessive daytime sleepiness 06/04/2020   COVID-19 long hauler manifesting chronic concentration deficit 06/04/2020   Severe obstructive sleep apnea-hypopnea syndrome 06/04/2020   Anxiety 05/07/2020  Moderate recurrent major depression ([place]) 05/07/2020   Rosacea 05/07/2020   Chronic migraine without aura, with intractable migraine, so stated, with status migrainosus 03/07/2020   Raynaud's disease    Ingrown  toenail 01/20/2020   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   Hemorrhoids 10/13/2018   History of colon polyps 10/13/2018   Cognitive complaints 05/14/2018   Other insomnia 05/14/2018   Reactive airway disease 04/13/2018   Scleroderma ([place]) 04/13/2018   Fibromyalgia 04/13/2018   Gastroesophageal reflux disease without esophagitis 04/13/2018   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 10/28/2017   Renal cyst 08/18/2017   Spondylosis of lumbar region without myelopathy or radiculopathy 07/17/2017   Ischial bursitis of left side 07/01/2017   Sacroiliitis ([place]) 06/05/2017   Irritable bowel syndrome with both constipation and diarrhea 04/17/2017   Thrombocytopenia ([place]) 04/17/2017   Low back pain 04/28/2016   Lumbar radiculopathy 04/28/2016   Acute confusional migraine, refractory 07/21/2014   Migraine without aura, not refractory 07/29/2013   Headache 04/28/2013   Intractable migraine 04/28/2013   Spondylolisthesis at L5-S1 level 11/29/2012   Mild major depression ([place]) 10/16/2009   Encounter for screening for cardiovascular disorders 03/11/2009   Essential hypertension 10/08/2006   Raynaud's phenomenon 10/08/2006   Raynaud's syndrome without gangrene 10/08/2006   Allergic rhinitis 10/16/2001   Migraine 10/16/2001    Aneta Hendershott,ANGIE, PTA 03/28/2021, 5:21 PM  [address]. [place], [place], [address] Phone: [phone]   Fax:  [phone]  Name: Jennifer Reed MRN: [id number] Date of Birth: 07-07-1965

## 2021-04-02 ENCOUNTER — Other Ambulatory Visit: Payer: Self-pay

## 2021-04-02 ENCOUNTER — Encounter: Payer: Self-pay | Admitting: Physical Therapy

## 2021-04-02 ENCOUNTER — Ambulatory Visit: Payer: Managed Care, Other (non HMO) | Admitting: Speech Pathology

## 2021-04-02 ENCOUNTER — Encounter: Payer: Self-pay | Admitting: Speech Pathology

## 2021-04-02 ENCOUNTER — Ambulatory Visit: Payer: Managed Care, Other (non HMO) | Admitting: Physical Therapy

## 2021-04-02 DIAGNOSIS — R2681 Unsteadiness on feet: Secondary | ICD-10-CM

## 2021-04-02 DIAGNOSIS — R41841 Cognitive communication deficit: Secondary | ICD-10-CM

## 2021-04-02 DIAGNOSIS — M6281 Muscle weakness (generalized): Secondary | ICD-10-CM | POA: Diagnosis not present

## 2021-04-02 DIAGNOSIS — Z9181 History of falling: Secondary | ICD-10-CM

## 2021-04-02 NOTE — Therapy (Signed)
[place], [place], [address] Phone: [phone]   Fax:  [phone]  Physical Therapy Treatment  Patient Details  Name: Jennifer Reed MRN: [id number] Date of Birth: 10/24/1965 Referring Provider (PT): Lazaro Arms   Encounter Date: 04/02/2021   PT End of Session - 04/02/21 1425     Visit Number 3    Number of Visits 17    Date for PT Re-Evaluation 04/23/21    Authorization Time Period 03/26/21 to 05/21/20    PT Start Time 1345    PT Stop Time 1425    PT Time Calculation (min) 40 min    Activity Tolerance Patient tolerated treatment well    Behavior During Therapy [facility] for tasks assessed/performed             Past Medical History:  Diagnosis Date   Acute confusional migraine, refractory 07/21/2014   Allergic rhinitis 10/16/2001   Rhinitis Allergic  NOS Rhinitis Allergic  NOS  Formatting of this note might be different from the original. Overview:  Rhinitis Allergic  NOS   Allergies    Anxiety 05/07/2020   Chronic migraine without aura, with intractable migraine, so stated, with status migrainosus 03/07/2020   Cognitive complaints 05/14/2018   COVID-19 long hauler manifesting chronic concentration deficit 06/04/2020   Dry eye syndrome    Encounter for screening for cardiovascular disorders 03/11/2009   Essential hypertension 10/08/2006   (Problem list name updated by automated process. Provider to review and confirm.) (Problem list name updated by automated process. Provider to review and confirm.)  Formatting of this note might be different from the original. Overview:  (Problem list name updated by automated process. Provider to review and confirm.)   Excessive daytime sleepiness 06/04/2020   Fibromyalgia    Gastritis    Gastroesophageal reflux disease without esophagitis 04/13/2018   GERD (gastroesophageal reflux disease)    Headache 04/28/2013   Problem list name updated by automated  process. Provider to review Problem list name updated by automated process. Provider to review  Formatting of this note might be different from the original. Overview:  Problem list name updated by automated process. Provider to review   Hemorrhoids 10/13/2018   High blood pressure    History of colon polyps 10/13/2018   IBS (irritable bowel syndrome)    Ingrown toenail 01/20/2020   Intractable migraine 04/28/2013   Formatting of this note might be different from the original. Overview:  Problem list name updated by automated process. Provider to review  Formatting of this note might be different from the original. Overview:  updating diagnosis code for icd10 cutover   Ischial bursitis of left side 07/01/2017   Added automatically from request for surgery [id number]   Low back pain 04/28/2016   Lumbar radiculopathy 04/28/2016   Migraine 10/16/2001   Problem list name updated by automated process. Provider to review Problem list name updated by automated process. Provider to review  updating diagnosis code for icd10 cutover updating diagnosis code for icd10 cutover  updating diagnosis code for icd10 cutover updating diagnosis code for icd10 cutover  Migraine Without Aura Migraine Without Aura  Formatting of this note might be different from th   Migraine without aura, not refractory 07/29/2013   Formatting of this note might be different from the original. Overview:  updating diagnosis code for icd10 cutover   Migraines    Mild major depression ([place]) 10/16/2009   Moderate recurrent major depression ([place]) 05/07/2020   Morning  headache 08/02/2020   Raynaud's disease    Raynaud's phenomenon 10/08/2006   (Problem list name updated by automated process. Provider to review and confirm.) (Problem list name updated by automated process. Provider to review and confirm.)   Raynaud's syndrome without gangrene 10/08/2006   Formatting of this note might be different from the original. Overview:  (Problem list  name updated by automated process. 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   Subjective Assessment - 04/02/21 1348     Subjective "Fine" reports a fall yesterday tripping over the rug. No injuries    Currently in Pain? No/denies                               [facility] Adult PT Treatment/Exercise - 04/02/21 0001       Lumbar Exercises: Aerobic   Nustep L5 x 5 min      Lumbar Exercises: Machines for Strengthening   Other Lumbar Machine Exercise Rows & Lats 15lb 2x10      Lumbar Exercises: Standing   Shoulder Extension Strengthening;Power Tower;Both;20 reps    Shoulder Extension Limitations 5      Knee/Hip Exercises: Machines for Strengthening   Cybex Knee Extension 5# 2 sets 10    Cybex Knee Flexion 20# 2 sets 10    Cybex Leg Press 30# 2 sets 10      Knee/Hip Exercises: Seated   Sit to Sand 2 sets;without UE support;10 reps   holding yellow ball                      PT Short Term Goals - 03/26/21 1720       PT SHORT TERM GOAL #1   Title Will be compliant with appropriate HEP to be progressed PRN    Time 4    Period Weeks    Status New     Target Date 04/23/21      PT SHORT TERM GOAL #2   Title Will be able to carry at least 10# 123ft through obstacle course in clinic without unsteadiness to show reduced fall risk while multitasking    Time 4    Period Weeks    Status New      PT SHORT TERM GOAL #3   Title Will be able to perform at least 20 minutes of low-moderate intensity activity involving lifting/carrying in order to show improved functional activity tolerance with RPE no higher than 5/10    Time 4    Period Weeks    Status New      PT SHORT TERM GOAL #4   Title Will  be able to verbalize at least 3 strategies to reduce fall risk and 3 strategies to promote energy conservation at home and in community    Time 4    Period Weeks    Status New               PT Long Term Goals - 03/26/21 1725       PT LONG TERM GOAL #1   Title MMT scores to improve by at least 1 grade in all weak muscle groups in order to show improvement in functional strength    Time 8    Period Weeks    Status New    Target Date 05/21/21      PT LONG TERM GOAL #2   Title Will be able to score at least 20/24 on DGI in order to show reduced fall risk    Time 8    Period Weeks    Status New      PT LONG TERM GOAL #3   Title Will be able to tolerate 45 minutes of moderate level  functional activity with no more than one rest break and RPE no more than 5/10 to show improved functional activity tolerance    Time 8    Period Weeks    Status New      PT LONG TERM GOAL #4   Title Will be able to perform two major daily activities (for example, grocery shopping AND going to a doctor's appointment) without increased fatigue    Baseline only able to perform one and very fatigued the rest of the day    Time 8    Period Weeks    Status New                   Plan - 04/02/21 1426     Clinical Impression Statement Fatigue and some shortness pf breath with activity, pt kept taking her mask off needing cues to put it back on. Cues  needed to complete full ROM with leg curls and extensions. Pt did report some R shoulder pain with seated rows. Postural core weakness present with standing shoulder extensions.    Personal Factors and Comorbidities Past/Current Experience;Comorbidity 3+;Time since onset of injury/illness/exacerbation    Comorbidities scleroderma, fibromyalgia, HTN, Raynaud's, lumbar radiculopathy, MDD, hx Covid, reactive airway syndrome    Examination-Activity Limitations Locomotion Level;Carry;Stairs;Lift;Other    Examination-Participation Restrictions Psychologist, forensic;Yard Work;Volunteer;Shop;Driving    Stability/Clinical Decision Making Evolving/Moderate complexity    Rehab Potential Good    PT Frequency 2x / week    PT Duration 8 weeks    PT Treatment/Interventions ADLs/Self Care Home Management;Cryotherapy;Electrical Stimulation;Iontophoresis 4mg /ml Dexamethasone;Moist Heat;Ultrasound;Gait training;Stair training;Functional mobility training;Therapeutic activities;Therapeutic exercise;Balance training;Neuromuscular re-education;Cognitive remediation;Patient/family education;Manual techniques;Energy conservation;Taping    PT Next Visit Plan assess response to tc today and work towards HEP she can complete correctly             Patient will benefit from skilled therapeutic intervention in order to improve the following deficits and impairments:  Decreased coordination, Decreased endurance, Cardiopulmonary status limiting activity, Decreased safety awareness, Dizziness, Decreased activity tolerance, Decreased balance, Decreased strength  Visit Diagnosis: Cognitive communication deficit  Unsteadiness on feet  History of falling  Muscle weakness (generalized)     Problem List Patient Active Problem List   Diagnosis Date Noted   Attention deficit hyperactivity disorder (ADHD), combined type 02/19/2021   Psychophysiological insomnia 02/19/2021   Abdominal bloating 12/10/2020    Microscopic colitis 12/10/2020   Migraines  IBS (irritable bowel syndrome)    GERD (gastroesophageal reflux disease)    Dry eye syndrome    High blood pressure    Allergies    Morning headache 08/02/2020   Excessive daytime sleepiness 06/04/2020   COVID-19 long hauler manifesting chronic concentration deficit 06/04/2020   Severe obstructive sleep apnea-hypopnea syndrome 06/04/2020   Anxiety 05/07/2020   Moderate recurrent major depression ([place]) 05/07/2020   Rosacea 05/07/2020   Chronic migraine without aura, with intractable migraine, so stated, with status migrainosus 03/07/2020   Raynaud's disease    Ingrown toenail 01/20/2020   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   Hemorrhoids 10/13/2018   History of colon polyps 10/13/2018   Cognitive complaints 05/14/2018   Other insomnia 05/14/2018   Reactive airway disease 04/13/2018   Scleroderma ([place]) 04/13/2018   Fibromyalgia 04/13/2018   Gastroesophageal reflux disease without esophagitis 04/13/2018   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 10/28/2017   Renal cyst 08/18/2017   Spondylosis of lumbar region without myelopathy or radiculopathy 07/17/2017   Ischial bursitis of left side 07/01/2017   Sacroiliitis ([place]) 06/05/2017   Irritable bowel syndrome with both constipation and diarrhea 04/17/2017   Thrombocytopenia ([place]) 04/17/2017   Low back pain 04/28/2016   Lumbar radiculopathy 04/28/2016   Acute confusional migraine, refractory 07/21/2014   Migraine without aura, not refractory 07/29/2013   Headache 04/28/2013   Intractable migraine 04/28/2013   Spondylolisthesis at L5-S1 level 11/29/2012   Mild major depression ([place]) 10/16/2009   Encounter for screening for cardiovascular disorders 03/11/2009   Essential hypertension 10/08/2006   Raynaud's phenomenon 10/08/2006   Raynaud's syndrome without gangrene 10/08/2006   Allergic rhinitis 10/16/2001   Migraine 10/16/2001    Scot Jun, PTA 04/02/2021, 2:32  PM  [place] [place], [place], [address] Phone: [phone]   Fax:  [phone]  Name: Shrita Brosseau MRN: [id number] Date of Birth: January 01, 1966

## 2021-04-02 NOTE — Therapy (Signed)
[place] [place], [place], [address] Phone: [phone]   Fax:  [phone]  Speech Language Pathology Treatment  Patient Details  Name: Jennifer Reed MRN: [id number] Date of Birth: Jul 11, 1965 Referring Provider (SLP): Fenton Foy NP   Encounter Date: 04/02/2021   End of Session - 04/02/21 1616     Visit Number 3    Number of Visits 17    Date for SLP Re-Evaluation 05/27/21    SLP Start Time 1445    SLP Stop Time  1530    SLP Time Calculation (min) 45 min    Activity Tolerance Patient tolerated treatment well             Past Medical History:  Diagnosis Date   Acute confusional migraine, refractory 07/21/2014   Allergic rhinitis 10/16/2001   Rhinitis Allergic  NOS Rhinitis Allergic  NOS  Formatting of this note might be different from the original. Overview:  Rhinitis Allergic  NOS   Allergies    Anxiety 05/07/2020   Chronic migraine without aura, with intractable migraine, so stated, with status migrainosus 03/07/2020   Cognitive complaints 05/14/2018   COVID-19 long hauler manifesting chronic concentration deficit 06/04/2020   Dry eye syndrome    Encounter for screening for cardiovascular disorders 03/11/2009   Essential hypertension 10/08/2006   (Problem list name updated by automated process. Provider to review and confirm.) (Problem list name updated by automated process. Provider to review and confirm.)  Formatting of this note might be different from the original. Overview:  (Problem list name updated by automated process. Provider to review and confirm.)   Excessive daytime sleepiness 06/04/2020   Fibromyalgia    Gastritis    Gastroesophageal reflux disease without esophagitis 04/13/2018   GERD (gastroesophageal reflux disease)    Headache 04/28/2013   Problem list name updated by automated process. Provider to review Problem list name updated by automated process. Provider to review   Formatting of this note might be different from the original. Overview:  Problem list name updated by automated process. Provider to review   Hemorrhoids 10/13/2018   High blood pressure    History of colon polyps 10/13/2018   IBS (irritable bowel syndrome)    Ingrown toenail 01/20/2020   Intractable migraine 04/28/2013   Formatting of this note might be different from the original. Overview:  Problem list name updated by automated process. Provider to review  Formatting of this note might be different from the original. Overview:  updating diagnosis code for icd10 cutover   Ischial bursitis of left side 07/01/2017   Added automatically from request for surgery [id number]   Low back pain 04/28/2016   Lumbar radiculopathy 04/28/2016   Migraine 10/16/2001   Problem list name updated by automated process. Provider to review Problem list name updated by automated process. Provider to review  updating diagnosis code for icd10 cutover updating diagnosis code for icd10 cutover  updating diagnosis code for icd10 cutover updating diagnosis code for icd10 cutover  Migraine Without Aura Migraine Without Aura  Formatting of this note might be different from th   Migraine without aura, not refractory 07/29/2013   Formatting of this note might be different from the original. Overview:  updating diagnosis code for icd10 cutover   Migraines    Mild major depression ([place]) 10/16/2009   Moderate recurrent major depression ([place]) 05/07/2020   Morning headache 08/02/2020   Raynaud's disease    Raynaud's phenomenon 10/08/2006   (Problem list  name updated by automated process. Provider to review and confirm.) (Problem list name updated by automated process. Provider to review and confirm.)   Raynaud's syndrome without gangrene 10/08/2006   Formatting of this note might be different from the original. Overview:  (Problem list name updated by automated process. 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   Subjective Assessment - 04/02/21 1614     Subjective "I am feeling tired."    Currently in Pain? No/denies                   ADULT SLP TREATMENT - 04/02/21 0001       General Information   Behavior/Cognition Alert;Cooperative;Pleasant mood      Treatment Provided   Treatment provided Cognitive-Linquistic      Cognitive-Linquistic Treatment   Treatment focused on Cognition    Skilled Treatment SLP observed pt to be more relaxed during today's visit. SLP trained pt in attention strategies to faciliate with cognitive fatigue. Pt demonstrated and understanding and provided personal examples of each. Pt expressed interest in obtaining a journal to write "to-do" list in, rather than having multiple post-it notes in various locations. SLP to cont. addressing attention strategies next visit and to follow up on relaxation strategies.      Assessment / Recommendations / Plan   Plan Continue with current plan of care      Progression Toward Goals   Progression toward goals Progressing toward goals                SLP Short Term Goals - 03/27/21 1036       SLP SHORT TERM GOAL #1   Title  Pt will increase auditory comprehension by recalling and verbalizing strategies to assist with active listening during conversation with minA.    Time 4    Period Weeks    Status New    Target Date 04/24/21      SLP SHORT TERM GOAL #2   Title Pt will comprehend functional memory or visual aids for recall of important information during structured conversations with minA.    Time 4    Period Weeks    Status New    Target Date 04/24/21              SLP Long Term Goals - 03/27/21 1032       SLP LONG TERM GOAL #1   Title Pt will increase auditory comprehension by recalling and verbalizing strategies to assist with active listening during unstructured conversation independently.    Time 8    Period Weeks    Status New    Target Date 05/22/21  SLP LONG TERM GOAL #2   Title Pt will comprehend functional memory or visual aids for recall of important information during unstructured conversations independently.    Time 8    Period Weeks    Status New    Target Date 05/22/21              Plan - 04/02/21 1617     Clinical Impression Statement See tx note. Cont with current POC.    Speech Therapy Frequency 2x / week    Duration 8 weeks    Treatment/Interventions Compensatory strategies;Cueing hierarchy;Functional tasks;Patient/family education;Environmental controls;Cognitive reorganization;Multimodal communcation approach;Compensatory techniques;Internal/external aids;SLP instruction and feedback    Potential to Achieve Goals Good    Consulted and Agree with Plan of Care Patient             Patient will benefit from skilled therapeutic intervention in order to improve the following deficits and impairments:   Cognitive communication deficit    Problem List Patient Active Problem List   Diagnosis Date Noted   Attention deficit hyperactivity disorder (ADHD), combined type 02/19/2021   Psychophysiological insomnia 02/19/2021   Abdominal bloating 12/10/2020    Microscopic colitis 12/10/2020   Migraines    IBS (irritable bowel syndrome)    GERD (gastroesophageal reflux disease)    Dry eye syndrome    High blood pressure    Allergies    Morning headache 08/02/2020   Excessive daytime sleepiness 06/04/2020   COVID-19 long hauler manifesting chronic concentration deficit 06/04/2020   Severe obstructive sleep apnea-hypopnea syndrome 06/04/2020   Anxiety 05/07/2020   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   Rosacea 05/07/2020   Chronic migraine without aura, with intractable migraine, so stated, with status migrainosus 03/07/2020   Raynaud's disease    Ingrown toenail 01/20/2020   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   Hemorrhoids 10/13/2018   History of colon polyps 10/13/2018   Cognitive complaints 05/14/2018   Other insomnia 05/14/2018   Reactive airway disease 04/13/2018   Scleroderma ([facility]) 04/13/2018   Fibromyalgia 04/13/2018   Gastroesophageal reflux disease without esophagitis 04/13/2018   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 10/28/2017   Renal cyst 08/18/2017   Spondylosis of lumbar region without myelopathy or radiculopathy 07/17/2017   Ischial bursitis of left side 07/01/2017   Sacroiliitis ([facility]) 06/05/2017   Irritable bowel syndrome with both constipation and diarrhea 04/17/2017   Thrombocytopenia ([facility]) 04/17/2017   Low back pain 04/28/2016   Lumbar radiculopathy 04/28/2016   Acute confusional migraine, refractory 07/21/2014   Migraine without aura, not refractory 07/29/2013   Headache 04/28/2013   Intractable migraine 04/28/2013   Spondylolisthesis at L5-S1 level 11/29/2012   Mild major depression ([facility]) 10/16/2009   Encounter for screening for cardiovascular disorders 03/11/2009   Essential hypertension 10/08/2006   Raynaud's phenomenon 10/08/2006   Raynaud's syndrome without gangrene 10/08/2006   Allergic rhinitis 10/16/2001   Migraine 10/16/2001    Dorena Bodo, CCC-SLP 04/02/2021, 4:19  PM  [facility] [place] [address] [place], [place], [address] Phone: [phone]   Fax:  [phone]   Name: Jennifer Reed MRN: [id number] Date of Birth: 03/11/1966

## 2021-04-08 ENCOUNTER — Ambulatory Visit: Payer: Managed Care, Other (non HMO) | Admitting: Physical Therapy

## 2021-04-08 ENCOUNTER — Encounter: Payer: Self-pay | Admitting: Speech Pathology

## 2021-04-08 ENCOUNTER — Ambulatory Visit: Payer: Managed Care, Other (non HMO) | Admitting: Speech Pathology

## 2021-04-08 ENCOUNTER — Encounter: Payer: Self-pay | Admitting: Physical Therapy

## 2021-04-08 ENCOUNTER — Other Ambulatory Visit: Payer: Self-pay

## 2021-04-08 DIAGNOSIS — R278 Other lack of coordination: Secondary | ICD-10-CM

## 2021-04-08 DIAGNOSIS — Z9181 History of falling: Secondary | ICD-10-CM

## 2021-04-08 DIAGNOSIS — R2681 Unsteadiness on feet: Secondary | ICD-10-CM

## 2021-04-08 DIAGNOSIS — M6281 Muscle weakness (generalized): Secondary | ICD-10-CM

## 2021-04-08 DIAGNOSIS — R41841 Cognitive communication deficit: Secondary | ICD-10-CM

## 2021-04-08 NOTE — Therapy (Signed)
[place]. [place], [place], [address] Phone: [phone]   Fax:  [phone]  Physical Therapy Treatment  Patient Details  Name: Jennifer Reed MRN: [id number] Date of Birth: 1966/02/14 Referring Provider (PT): Lazaro Arms   Encounter Date: 04/08/2021   PT End of Session - 04/08/21 1208     Visit Number 4    Number of Visits 17    Date for PT Re-Evaluation 04/23/21    Authorization Type [employer]    Authorization Time Period 03/26/21 to 05/21/20    PT Start Time 1103    PT Stop Time 1143    PT Time Calculation (min) 40 min    Activity Tolerance Patient tolerated treatment well    Behavior During Therapy [facility] for tasks assessed/performed             Past Medical History:  Diagnosis Date   Acute confusional migraine, refractory 07/21/2014   Allergic rhinitis 10/16/2001   Rhinitis Allergic  NOS Rhinitis Allergic  NOS  Formatting of this note might be different from the original. Overview:  Rhinitis Allergic  NOS   Allergies    Anxiety 05/07/2020   Chronic migraine without aura, with intractable migraine, so stated, with status migrainosus 03/07/2020   Cognitive complaints 05/14/2018   COVID-19 long hauler manifesting chronic concentration deficit 06/04/2020   Dry eye syndrome    Encounter for screening for cardiovascular disorders 03/11/2009   Essential hypertension 10/08/2006   (Problem list name updated by automated process. Provider to review and confirm.) (Problem list name updated by automated process. Provider to review and confirm.)  Formatting of this note might be different from the original. Overview:  (Problem list name updated by automated process. Provider to review and confirm.)   Excessive daytime sleepiness 06/04/2020   Fibromyalgia    Gastritis    Gastroesophageal reflux disease without esophagitis 04/13/2018   GERD (gastroesophageal reflux disease)    Headache 04/28/2013   Problem list  name updated by automated process. Provider to review Problem list name updated by automated process. Provider to review  Formatting of this note might be different from the original. Overview:  Problem list name updated by automated process. Provider to review   Hemorrhoids 10/13/2018   High blood pressure    History of colon polyps 10/13/2018   IBS (irritable bowel syndrome)    Ingrown toenail 01/20/2020   Intractable migraine 04/28/2013   Formatting of this note might be different from the original. Overview:  Problem list name updated by automated process. Provider to review  Formatting of this note might be different from the original. Overview:  updating diagnosis code for icd10 cutover   Ischial bursitis of left side 07/01/2017   Added automatically from request for surgery [id number]   Low back pain 04/28/2016   Lumbar radiculopathy 04/28/2016   Migraine 10/16/2001   Problem list name updated by automated process. Provider to review Problem list name updated by automated process. Provider to review  updating diagnosis code for icd10 cutover updating diagnosis code for icd10 cutover  updating diagnosis code for icd10 cutover updating diagnosis code for icd10 cutover  Migraine Without Aura Migraine Without Aura  Formatting of this note might be different from th   Migraine without aura, not refractory 07/29/2013   Formatting of this note might be different from the original. Overview:  updating diagnosis code for icd10 cutover   Migraines    Mild major depression ([place]) 10/16/2009   Moderate recurrent major  depression ([place]) 05/07/2020   Morning headache 08/02/2020   Raynaud's disease    Raynaud's phenomenon 10/08/2006   (Problem list name updated by automated process. Provider to review and confirm.) (Problem list name updated by automated process. Provider to review and confirm.)   Raynaud's syndrome without gangrene 10/08/2006   Formatting of this note might be different from the original.  Overview:  (Problem list name updated by automated process. 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   Subjective Assessment - 04/08/21 1104     Subjective I tend to trip over things, I don't want to hurt my arms especially after carpal tunnel surgery so I try to find a soft landing. I really want to focus on improving my balance.    Pertinent History hx Covid 2020, scleroderma, fibro, HTN    Patient Stated Goals feel better, get up at 8-9am and feel energized, be able to do more physically, be able to shop and handle things on my own    Currently in Pain? Yes    Pain Score 6     Pain Location Hip    Pain Orientation Left    Pain Descriptors / Indicators Stabbing;Shooting    Pain Type Chronic pain    Pain Radiating Towards down the leg and up the back    Pain Onset More than a month ago    Pain Frequency Constant    Aggravating Factors  laying or sitting    Pain Relieving Factors nothing                                    Balance Exercises - 04/08/21 0001       Balance Exercises: Standing   Tandem Stance Eyes open;Foam/compliant surface;3 reps;20 secs     SLS Eyes open;Solid surface;3 reps;15 secs    Tandem Gait Forward;5 reps   in // bars   Sidestepping Foam/compliant support;4 reps   in // bars   Marching Foam/compliant surface;20 reps    Heel Raises 20 reps   on foam   Toe Raise 20 reps   on foam   Other Standing Exercises stepping over cones during tandem gait x5 in // bars   forward and lateral x5 each   Other Standing Exercises Comments 3 way star taps on foam 1x10 B; forward/backward walking with ball toss at various angles                PT Education - 04/08/21 1207     Education Details plan for vestibular w/u next session    Person(s) Educated Patient  Methods Explanation    Comprehension Verbalized understanding              PT Short Term Goals - 03/26/21 1720       PT SHORT TERM GOAL #1   Title Will be compliant with appropriate HEP to be progressed PRN    Time 4    Period Weeks    Status New    Target Date 04/23/21      PT SHORT TERM GOAL #2   Title Will be able to carry at least 10# 149ft through obstacle course in clinic without unsteadiness to show reduced fall risk while multitasking    Time 4    Period Weeks    Status New      PT SHORT TERM GOAL #3   Title Will be able to perform at least 20 minutes of low-moderate intensity activity involving lifting/carrying in order to show improved functional activity tolerance with RPE no higher than 5/10    Time 4    Period Weeks    Status New      PT SHORT TERM GOAL #4   Title Will be able to verbalize at least 3 strategies to reduce fall risk and 3 strategies to promote energy conservation at home and in community    Time 4    Period Weeks    Status New               PT Long Term Goals - 03/26/21 1725       PT LONG TERM GOAL #1   Title MMT scores to improve by at least 1 grade in all weak muscle groups in order to show improvement in functional strength    Time 8    Period Weeks    Status New    Target Date 05/21/21      PT LONG  TERM GOAL #2   Title Will be able to score at least 20/24 on DGI in order to show reduced fall risk    Time 8    Period Weeks    Status New      PT LONG TERM GOAL #3   Title Will be able to tolerate 45 minutes of moderate level  functional activity with no more than one rest break and RPE no more than 5/10 to show improved functional activity tolerance    Time 8    Period Weeks    Status New      PT LONG TERM GOAL #4   Title Will be able to perform two major daily activities (for example, grocery shopping AND going to a doctor's appointment) without increased fatigue    Baseline only able to perform one and very fatigued the rest of the day    Time 8    Period Weeks    Status New                   Plan - 04/08/21 1208     Clinical Impression Statement Jennifer Reed arrives today reporting more falls and that she really would really like to work on her balance. Spent session progressing balance activities today- actually able to complete advanced tasks today with some unsteadiness but was able to catch/correct her balance and only needed occasional MinA for safety/balance recovery. Functional activity tolerance appears to be improving as well. Does become a bit internally and externally distracted sometimes, but much better since eval. Will continue to progress. Would benefit from at least a vestibular screen next session  as she does continue to report intermittent dizziness.    Personal Factors and Comorbidities Past/Current Experience;Comorbidity 3+;Time since onset of injury/illness/exacerbation    Comorbidities scleroderma, fibromyalgia, HTN, Raynaud's, lumbar radiculopathy, MDD, hx Covid, reactive airway syndrome    Examination-Activity Limitations Locomotion Level;Carry;Stairs;Lift;Other    Examination-Participation Restrictions Art gallery manager;Yard Work;Volunteer;Shop;Driving    Stability/Clinical Decision Making Evolving/Moderate complexity    Clinical Decision Making  Moderate    Rehab Potential Good    PT Frequency 2x / week    PT Duration 8 weeks    PT Treatment/Interventions ADLs/Self Care Home Management;Cryotherapy;Electrical Stimulation;Iontophoresis 4mg /ml Dexamethasone;Moist Heat;Ultrasound;Gait training;Stair training;Functional mobility training;Therapeutic activities;Therapeutic exercise;Balance training;Neuromuscular re-education;Cognitive remediation;Patient/family education;Manual techniques;Energy conservation;Taping    PT Next Visit Plan vestibular work up; continue working on Lobbyist, work towards [employer] she can complete correctly    [employer] and Agree with Plan of Care Patient             Patient will benefit from skilled therapeutic intervention in order to improve the following deficits and impairments:  Decreased coordination, Decreased endurance, Cardiopulmonary status limiting activity, Decreased safety awareness, Dizziness, Decreased activity tolerance, Decreased balance, Decreased strength  Visit Diagnosis: Unsteadiness on feet  History of falling  Muscle weakness (generalized)  Other lack of coordination     Problem List Patient Active Problem List   Diagnosis Date Noted   Attention deficit hyperactivity disorder (ADHD), combined type 02/19/2021   Psychophysiological insomnia 02/19/2021   Abdominal bloating 12/10/2020   Microscopic colitis 12/10/2020   Migraines    IBS (irritable bowel syndrome)    GERD (gastroesophageal reflux disease)    Dry eye syndrome    High blood pressure    Allergies    Morning headache 08/02/2020   Excessive daytime sleepiness 06/04/2020   COVID-19 long hauler manifesting chronic concentration deficit 06/04/2020   Severe obstructive sleep apnea-hypopnea syndrome 06/04/2020   Anxiety 05/07/2020   Moderate recurrent major depression ([place]) 05/07/2020   Rosacea 05/07/2020   Chronic migraine without aura, with intractable migraine, so stated, with status migrainosus 03/07/2020    Raynaud's disease    Ingrown toenail 01/20/2020   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   Hemorrhoids 10/13/2018   History of colon polyps 10/13/2018   Cognitive complaints 05/14/2018   Other insomnia 05/14/2018   Reactive airway disease 04/13/2018   Scleroderma ([place]) 04/13/2018   Fibromyalgia 04/13/2018   Gastroesophageal reflux disease without esophagitis 04/13/2018   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 10/28/2017   Renal cyst 08/18/2017   Spondylosis of lumbar region without myelopathy or radiculopathy 07/17/2017   Ischial bursitis of left side 07/01/2017   Sacroiliitis ([place]) 06/05/2017   Irritable bowel syndrome with both constipation and diarrhea 04/17/2017   Thrombocytopenia ([place]) 04/17/2017   Low back pain 04/28/2016   Lumbar radiculopathy 04/28/2016   Acute confusional migraine, refractory 07/21/2014   Migraine without aura, not refractory 07/29/2013   Headache 04/28/2013   Intractable migraine 04/28/2013   Spondylolisthesis at L5-S1 level 11/29/2012   Mild major depression ([place]) 10/16/2009   Encounter for screening for cardiovascular disorders 03/11/2009   Essential hypertension 10/08/2006   Raynaud's phenomenon 10/08/2006   Raynaud's syndrome without gangrene 10/08/2006   Allergic rhinitis 10/16/2001   Migraine 10/16/2001   Ann Lions PT, DPT, PN2   Supplemental Physical Therapist  [facility]    Pager [phone] Acute Rehab Office [place]. [place], [place], [address] Phone: [phone]   Fax:  [phone]  Name: Jennifer Reed MRN: [id number]  Date of Birth: 05-23-1965
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
## 2021-04-11 ENCOUNTER — Ambulatory Visit: Payer: Managed Care, Other (non HMO) | Admitting: Speech Pathology

## 2021-04-11 ENCOUNTER — Ambulatory Visit: Payer: Managed Care, Other (non HMO) | Admitting: Physical Therapy

## 2021-04-16 ENCOUNTER — Ambulatory Visit: Payer: Managed Care, Other (non HMO) | Admitting: Physical Therapy

## 2021-04-16 ENCOUNTER — Encounter: Payer: Self-pay | Admitting: Neurology

## 2021-04-16 ENCOUNTER — Ambulatory Visit: Payer: Managed Care, Other (non HMO) | Admitting: Speech Pathology

## 2021-04-19 ENCOUNTER — Encounter: Payer: Self-pay | Admitting: Neurology

## 2021-04-19 DIAGNOSIS — G43711 Chronic migraine without aura, intractable, with status migrainosus: Secondary | ICD-10-CM

## 2021-04-22 ENCOUNTER — Ambulatory Visit: Payer: Managed Care, Other (non HMO) | Attending: Nurse Practitioner | Admitting: Physical Therapy

## 2021-04-22 ENCOUNTER — Other Ambulatory Visit: Payer: Self-pay

## 2021-04-22 ENCOUNTER — Encounter: Payer: Self-pay | Admitting: Physical Therapy

## 2021-04-22 ENCOUNTER — Ambulatory Visit: Payer: Managed Care, Other (non HMO) | Admitting: Speech Pathology

## 2021-04-22 DIAGNOSIS — R42 Dizziness and giddiness: Secondary | ICD-10-CM | POA: Diagnosis present

## 2021-04-22 DIAGNOSIS — R278 Other lack of coordination: Secondary | ICD-10-CM | POA: Insufficient documentation

## 2021-04-22 DIAGNOSIS — M6281 Muscle weakness (generalized): Secondary | ICD-10-CM | POA: Diagnosis present

## 2021-04-22 DIAGNOSIS — Z9181 History of falling: Secondary | ICD-10-CM | POA: Insufficient documentation

## 2021-04-22 DIAGNOSIS — R2681 Unsteadiness on feet: Secondary | ICD-10-CM | POA: Diagnosis present

## 2021-04-22 NOTE — Therapy (Signed)
[facility] [address] [place], [place], [address] Phone: [phone]   Fax:  [phone]  Physical Therapy Treatment  Patient Details  Name: Jennifer Reed MRN: [id number] Date of Birth: [birth year] Referring Provider (PT): Angus Seller   Encounter Date: 04/22/2021   PT End of Session - 04/22/21 1218     Visit Number 5    Number of Visits 17    Date for PT Re-Evaluation 04/23/21    Authorization Type [employer]    Authorization Time Period 03/26/21 to 05/21/20    PT Start Time 1104    PT Stop Time 1148    PT Time Calculation (min) 44 min    Activity Tolerance Patient tolerated treatment well    Behavior During Therapy [facility] for tasks assessed/performed             Past Medical History:  Diagnosis Date   Acute confusional migraine, refractory 07/21/2014   Allergic rhinitis 10/16/2001   Rhinitis Allergic  NOS Rhinitis Allergic  NOS  Formatting of this note might be different from the original. Overview:  Rhinitis Allergic  NOS   Allergies    Anxiety 05/07/2020   Chronic migraine without aura, with intractable migraine, so stated, with status migrainosus 03/07/2020   Cognitive complaints 05/14/2018   COVID-19 long hauler manifesting chronic concentration deficit 06/04/2020   Dry eye syndrome    Encounter for screening for cardiovascular disorders 03/11/2009   Essential hypertension 10/08/2006   (Problem list name updated by automated process. Provider to review and confirm.) (Problem list name updated by automated process. Provider to review and confirm.)  Formatting of this note might be different from the original. Overview:  (Problem list name updated by automated process. Provider to review and confirm.)   Excessive daytime sleepiness 06/04/2020   Fibromyalgia    Gastritis    Gastroesophageal reflux disease without esophagitis 04/13/2018   GERD (gastroesophageal reflux disease)    Headache 04/28/2013   Problem list  name updated by automated process. Provider to review Problem list name updated by automated process. Provider to review  Formatting of this note might be different from the original. Overview:  Problem list name updated by automated process. Provider to review   Hemorrhoids 10/13/2018   High blood pressure    History of colon polyps 10/13/2018   IBS (irritable bowel syndrome)    Ingrown toenail 01/20/2020   Intractable migraine 04/28/2013   Formatting of this note might be different from the original. Overview:  Problem list name updated by automated process. Provider to review  Formatting of this note might be different from the original. Overview:  updating diagnosis code for icd10 cutover   Ischial bursitis of left side 07/01/2017   Added automatically from request for surgery [id number]   Low back pain 04/28/2016   Lumbar radiculopathy 04/28/2016   Migraine 10/16/2001   Problem list name updated by automated process. Provider to review Problem list name updated by automated process. Provider to review  updating diagnosis code for icd10 cutover updating diagnosis code for icd10 cutover  updating diagnosis code for icd10 cutover updating diagnosis code for icd10 cutover  Migraine Without Aura Migraine Without Aura  Formatting of this note might be different from th   Migraine without aura, not refractory 07/29/2013   Formatting of this note might be different from the original. Overview:  updating diagnosis code for icd10 cutover   Migraines    Mild major depression (HCC) 10/16/2009   Moderate recurrent major  depression ([place]) 05/07/2020   Morning headache 08/02/2020   Raynaud's disease    Raynaud's phenomenon 10/08/2006   (Problem list name updated by automated process. Provider to review and confirm.) (Problem list name updated by automated process. Provider to review and confirm.)   Raynaud's syndrome without gangrene 10/08/2006   Formatting of this note might be different from the original.  Overview:  (Problem list name updated by automated process. 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   Subjective Assessment - 04/22/21 1218     Subjective A little more energy, but having "strange" dizzy spells. She cannot identify any triggers. She feels like a ping pong ball. She just noticed them since Covid. She has had double vision since Covid, has prism glasses to correct. She cannot tell whether her falls occur when wearing them or not. Eye Dr who prescribed prism glasses Marcial Pacas                     Vestibular Assessment - 04/22/21 0001       Symptom Behavior   Subjective history of current problem Patient reports she feels like she is spinning at times,    Frequency of Dizziness unable to say    Aggravating Factors No known aggravating factors      Oculomotor Exam   Oculomotor Alignment Normal    Smooth Pursuits Saccades      Oculomotor Exam-Fixation Suppressed    Ocular ROM wnl      Vestibulo-Ocular Reflex   VOR 1 Head Only (x 1 viewing) Produced 8/10 dizziness iwth horizontal movement, no dizziness iwth vertical.    VOR 2 Head and Object (x 2 viewing)  Elicited Dizziness after 20 seconds of horizontal movement, no dizziness iwth virtical challenge.      Positional Testing   Dix-Hallpike Dix-Hallpike Right;Dix-Hallpike Left      Dix-Hallpike Right   Dix-Hallpike Right Symptoms No nystagmus      Dix-Hallpike Left   Dix-Hallpike Left Symptoms No nystagmus                               PT Education - 04/22/21 1215     Education Details HEP for occular motor desensitazation. plan to add physical HEP for balance as well.    Person(s) Educated Patient    Methods Explanation;Demonstration;Handout    Comprehension Verbalized understanding;Returned demonstration              PT Short Term Goals - 03/26/21 1720       PT SHORT TERM GOAL #1   Title Will be compliant with appropriate HEP to be  progressed PRN    Time 4    Period Weeks    Status New    Target Date 04/23/21      PT SHORT TERM GOAL #2   Title Will be able to carry at least 10# 124ft through obstacle course in clinic without unsteadiness to show reduced fall risk while multitasking    Time 4    Period Weeks    Status New      PT SHORT TERM GOAL #3   Title Will be able to perform at least 20 minutes of low-moderate intensity activity involving lifting/carrying in order to show improved functional activity tolerance with RPE no higher than 5/10    Time 4    Period Weeks    Status New      PT SHORT TERM GOAL #4   Title Will be able to verbalize at least 3 strategies to reduce fall risk and 3 strategies to promote energy conservation at home and in community    Time 4    Period Weeks    Status New               PT Long Term Goals - 03/26/21 1725       PT LONG TERM GOAL #1   Title MMT scores to improve by at least 1 grade in all weak muscle groups in order to show improvement in functional strength    Time 8    Period Weeks    Status New    Target Date 05/21/21      PT LONG TERM GOAL #2   Title Will be able to score at least  20/24 on DGI in order to show reduced fall risk    Time 8    Period Weeks    Status New      PT LONG TERM GOAL #3   Title Will be able to tolerate 45 minutes of moderate level  functional activity with no more than one rest break and RPE no more than 5/10 to show improved functional activity tolerance    Time 8    Period Weeks    Status New      PT LONG TERM GOAL #4   Title Will be able to perform two major daily activities (for example, grocery shopping AND going to a doctor's appointment) without increased fatigue    Baseline only able to perform one and very fatigued the rest of the day    Time [place] - 04/22/21 1219     Clinical Impression Statement Patient reports continued dizziness. She was not able to identify many specifics, but throughout treatment, her issues came out. Performed Vestibular assessment. Her dizziness was triggered by horizontal head movments, with nystagmus and dizziness. Dix-Hallpike (-) B. She also reported that she was issued prizm glasses by her Opthamalogist, Marcial Pacas. She wears them intermittantly, but could not report if dizziness and falls happen with or without them. Therpaist advized her to wear the glass as much as possible and provided HEP for desensitization.    Personal Factors and Comorbidities Past/Current Experience;Comorbidity 3+;Time since onset of injury/illness/exacerbation    Comorbidities scleroderma, fibromyalgia, HTN, Raynaud's, lumbar radiculopathy, MDD, hx Covid, reactive airway syndrome    Examination-Activity Limitations Locomotion Level;Carry;Stairs;Lift;Other    Examination-Participation Restrictions Art gallery manager;Yard Work;Volunteer;Shop;Driving    Stability/Clinical Decision Making Evolving/Moderate  complexity    Rehab Potential Good    PT Frequency 2x / week    PT Duration 6 weeks    PT Treatment/Interventions ADLs/Self Care Home  Management;Cryotherapy;Electrical Stimulation;Iontophoresis 4mg /ml Dexamethasone;Moist Heat;Ultrasound;Gait training;Stair training;Functional mobility training;Therapeutic activities;Therapeutic exercise;Balance training;Neuromuscular re-education;Cognitive remediation;Patient/family education;Manual techniques;Energy conservation;Taping    PT Next Visit Plan Re-asses vestibular symptoms, initiate physical HEP for strength, balance, activity tolerance.    PT Home Exercise Plan Vestibular re-education.    Consulted and Agree with Plan of Care Patient             Patient will benefit from skilled therapeutic intervention in order to improve the following deficits and impairments:  Decreased coordination, Decreased endurance, Cardiopulmonary status limiting activity, Decreased safety awareness, Dizziness, Decreased activity tolerance, Decreased balance, Decreased strength  Visit Diagnosis: Dizziness and giddiness  Unsteadiness on feet  History of falling  Muscle weakness (generalized)  Other lack of coordination     Problem List Patient Active Problem List   Diagnosis Date Noted   Attention deficit hyperactivity disorder (ADHD), combined type 02/19/2021   Psychophysiological insomnia 02/19/2021   Abdominal bloating 12/10/2020   Microscopic colitis 12/10/2020   Migraines    IBS (irritable bowel syndrome)    GERD (gastroesophageal reflux disease)    Dry eye syndrome    High blood pressure    Allergies    Morning headache 08/02/2020   Excessive daytime sleepiness 06/04/2020   COVID-19 long hauler manifesting chronic concentration deficit 06/04/2020   Severe obstructive sleep apnea-hypopnea syndrome 06/04/2020   Anxiety 05/07/2020   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   Rosacea 05/07/2020   Chronic migraine without aura, with intractable migraine, so stated, with status migrainosus 03/07/2020   Raynaud's disease    Ingrown toenail 01/20/2020    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   Hemorrhoids 10/13/2018   History of colon polyps 10/13/2018   Cognitive complaints 05/14/2018   Other insomnia 05/14/2018   Reactive airway disease 04/13/2018   Scleroderma (HCC) 04/13/2018   Fibromyalgia 04/13/2018   Gastroesophageal reflux disease without esophagitis 04/13/2018   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 10/28/2017   Renal cyst 08/18/2017   Spondylosis of lumbar region without myelopathy or radiculopathy 07/17/2017   Ischial bursitis of left side 07/01/2017   Sacroiliitis (HCC) 06/05/2017   Irritable bowel syndrome with both constipation and diarrhea 04/17/2017   Thrombocytopenia (HCC) 04/17/2017   Low back pain 04/28/2016   Lumbar radiculopathy 04/28/2016   Acute confusional migraine, refractory 07/21/2014   Migraine without aura, not refractory 07/29/2013   Headache 04/28/2013   Intractable migraine 04/28/2013   Spondylolisthesis at L5-S1 level 11/29/2012   Mild major depression (HCC) 10/16/2009   Encounter for screening for cardiovascular disorders 03/11/2009   Essential hypertension 10/08/2006   Raynaud's phenomenon 10/08/2006   Raynaud's syndrome without gangrene 10/08/2006   Allergic rhinitis 10/16/2001   Migraine 10/16/2001    12/16/2001, DPT 04/22/2021, 12:26 PM  [facility] [address] [place], [place], [place] Phone: [phone]   Fax:  [phone]  Name: Jennifer Reed MRN: [name] Date of Birth: 1965-07-24

## 2021-04-22 NOTE — Patient Instructions (Signed)
Vestibular Rehab exercises-occular motor desensitation with horizontal head movements VOR 1 x 1 and VOR x 2, smooth persuits, saccades.

## 2021-04-24 ENCOUNTER — Ambulatory Visit: Payer: Managed Care, Other (non HMO) | Admitting: Physical Therapy

## 2021-04-24 ENCOUNTER — Ambulatory Visit: Payer: Managed Care, Other (non HMO) | Admitting: Speech Pathology

## 2021-04-24 MED ORDER — BOTOX 200 UNITS IJ SOLR: INTRAMUSCULAR | 1 refills | Status: DC

## 2021-04-24 NOTE — Telephone Encounter (Signed)
Received (1) 200 unit vial of Botox today from [facility]. 

## 2021-05-06 ENCOUNTER — Encounter: Payer: Self-pay | Admitting: Neurology

## 2021-05-08 ENCOUNTER — Ambulatory Visit: Payer: Managed Care, Other (non HMO) | Admitting: Speech Pathology

## 2021-05-08 ENCOUNTER — Ambulatory Visit: Payer: Managed Care, Other (non HMO) | Admitting: Physical Therapy

## 2021-05-08 MED ORDER — TOPIRAMATE 25 MG PO TABS: ORAL_TABLET | ORAL | 0 refills | Status: DC

## 2021-05-11 ENCOUNTER — Encounter: Payer: Self-pay | Admitting: Neurology

## 2021-05-14 ENCOUNTER — Ambulatory Visit: Payer: Managed Care, Other (non HMO) | Admitting: Physical Therapy

## 2021-05-14 ENCOUNTER — Ambulatory Visit: Payer: Managed Care, Other (non HMO) | Admitting: Speech Pathology

## 2021-05-14 MED ORDER — TOPIRAMATE 50 MG PO TABS: ORAL_TABLET | ORAL | 0 refills | Status: DC

## 2021-05-16 ENCOUNTER — Other Ambulatory Visit: Payer: Self-pay

## 2021-05-16 ENCOUNTER — Encounter: Payer: Self-pay | Admitting: Speech Pathology

## 2021-05-16 ENCOUNTER — Ambulatory Visit: Payer: Managed Care, Other (non HMO) | Attending: Nurse Practitioner | Admitting: Speech Pathology

## 2021-05-16 ENCOUNTER — Ambulatory Visit: Payer: Managed Care, Other (non HMO) | Admitting: Physical Therapy

## 2021-05-16 DIAGNOSIS — R41841 Cognitive communication deficit: Secondary | ICD-10-CM | POA: Insufficient documentation

## 2021-05-16 NOTE — Therapy (Signed)
[facility] [place], [place], [address] Phone: [phone]   Fax:  [phone]  Speech Language Pathology Treatment  Patient Details  Name: Jennifer Reed MRN: [id number] Date of Birth: 1966/03/26 Referring Provider (SLP): Fenton Foy NP   Encounter Date: 05/16/2021   End of Session - 05/16/21 1632     Visit Number 5    Number of Visits 17    Date for SLP Re-Evaluation 05/27/21    SLP Start Time 63    SLP Stop Time  1315    SLP Time Calculation (min) 45 min    Activity Tolerance Patient tolerated treatment well             Past Medical History:  Diagnosis Date   Acute confusional migraine, refractory 07/21/2014   Allergic rhinitis 10/16/2001   Rhinitis Allergic  NOS Rhinitis Allergic  NOS  Formatting of this note might be different from the original. Overview:  Rhinitis Allergic  NOS   Allergies    Anxiety 05/07/2020   Chronic migraine without aura, with intractable migraine, so stated, with status migrainosus 03/07/2020   Cognitive complaints 05/14/2018   COVID-19 long hauler manifesting chronic concentration deficit 06/04/2020   Dry eye syndrome    Encounter for screening for cardiovascular disorders 03/11/2009   Essential hypertension 10/08/2006   (Problem list name updated by automated process. Provider to review and confirm.) (Problem list name updated by automated process. Provider to review and confirm.)  Formatting of this note might be different from the original. Overview:  (Problem list name updated by automated process. Provider to review and confirm.)   Excessive daytime sleepiness 06/04/2020   Fibromyalgia    Gastritis    Gastroesophageal reflux disease without esophagitis 04/13/2018   GERD (gastroesophageal reflux disease)    Headache 04/28/2013   Problem list name updated by automated process. Provider to review Problem list name updated by automated process. Provider to review   Formatting of this note might be different from the original. Overview:  Problem list name updated by automated process. Provider to review   Hemorrhoids 10/13/2018   High blood pressure    History of colon polyps 10/13/2018   IBS (irritable bowel syndrome)    Ingrown toenail 01/20/2020   Intractable migraine 04/28/2013   Formatting of this note might be different from the original. Overview:  Problem list name updated by automated process. Provider to review  Formatting of this note might be different from the original. Overview:  updating diagnosis code for icd10 cutover   Ischial bursitis of left side 07/01/2017   Added automatically from request for surgery [id number]   Low back pain 04/28/2016   Lumbar radiculopathy 04/28/2016   Migraine 10/16/2001   Problem list name updated by automated process. Provider to review Problem list name updated by automated process. Provider to review  updating diagnosis code for icd10 cutover updating diagnosis code for icd10 cutover  updating diagnosis code for icd10 cutover updating diagnosis code for icd10 cutover  Migraine Without Aura Migraine Without Aura  Formatting of this note might be different from th   Migraine without aura, not refractory 07/29/2013   Formatting of this note might be different from the original. Overview:  updating diagnosis code for icd10 cutover   Migraines    Mild major depression ([place]) 10/16/2009   Moderate recurrent major depression ([place]) 05/07/2020   Morning headache 08/02/2020   Raynaud's disease    Raynaud's phenomenon 10/08/2006   (Problem list  name updated by automated process. Provider to review and confirm.) (Problem list name updated by automated process. Provider to review and confirm.)   Raynaud's syndrome without gangrene 10/08/2006   Formatting of this note might be different from the original. Overview:  (Problem list name updated by automated process. 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   Subjective Assessment - 05/16/21 1631     Subjective "I'm doing better."    Currently in Pain? No/denies                   ADULT SLP TREATMENT - 05/16/21 1628       General Information   Behavior/Cognition Alert;Cooperative;Pleasant mood      Treatment Provided   Treatment provided Cognitive-Linquistic      Cognitive-Linquistic Treatment   Treatment focused on Cognition    Skilled Treatment Reviewed attention strategies that we have already discussed this session (due to block of time between sessions). Pt reports she feels her thinking skills have improved and reports she has become more organized, is using relaxation strategies, and is monitoring fatigue levels which is increasing comprehension of information. Edu on multitasking and task completion. Pt reported she feels this would be benefical for her moving forward. Pt reported the word for the week would be "consistency". Pt required occasional redirection to task; however, required less cueing than previous sessions. Cont attention strategies.      Assessment / Recommendations / Plan   Plan Continue with current plan of care       Progression Toward Goals   Progression toward goals Progressing toward goals                SLP Short Term Goals - 05/16/21 1633       SLP SHORT TERM GOAL #1   Title Pt will increase auditory comprehension by recalling and verbalizing strategies to assist with active listening during conversation with minA.    Time 4    Period Weeks    Status Not Met    Target Date 04/24/21      SLP SHORT TERM GOAL #2   Title Pt will comprehend functional memory or visual aids for recall of important information during structured conversations with minA.    Time 4    Period Weeks    Status Not Met    Target Date 04/24/21              SLP Long Term Goals - 03/27/21 1032       SLP LONG TERM GOAL #1   Title Pt will increase auditory comprehension by recalling and verbalizing strategies to assist with active listening during unstructured conversation independently.  Time 8    Period Weeks    Status New    Target Date 05/22/21      SLP LONG TERM GOAL #2   Title Pt will comprehend functional memory or visual aids for recall of important information during unstructured conversations independently.    Time 8    Period Weeks    Status New    Target Date 05/22/21              Plan - 05/16/21 1633     Clinical Impression Statement See tx note. To discuss impulse control (as pt reports this is an area of difficulty) next session *have not discussed 05/16/20. Cont with current POC.    Speech Therapy Frequency 2x / week    Duration 8 weeks    Treatment/Interventions Compensatory strategies;Cueing hierarchy;Functional tasks;Patient/family education;Environmental controls;Cognitive reorganization;Multimodal communcation approach;Compensatory techniques;Internal/external aids;SLP instruction and feedback    Potential to Achieve Goals Good    Consulted and Agree with Plan of Care Patient             Patient will benefit from skilled therapeutic intervention in order to improve  the following deficits and impairments:   Cognitive communication deficit    Problem List Patient Active Problem List   Diagnosis Date Noted   Attention deficit hyperactivity disorder (ADHD), combined type 02/19/2021   Psychophysiological insomnia 02/19/2021   Abdominal bloating 12/10/2020   Microscopic colitis 12/10/2020   Migraines    IBS (irritable bowel syndrome)    GERD (gastroesophageal reflux disease)    Dry eye syndrome    High blood pressure    Allergies    Morning headache 08/02/2020   Excessive daytime sleepiness 06/04/2020   COVID-19 long hauler manifesting chronic concentration deficit 06/04/2020   Severe obstructive sleep apnea-hypopnea syndrome 06/04/2020   Anxiety 05/07/2020   Moderate recurrent major depression ([place]) 05/07/2020   Rosacea 05/07/2020   Chronic migraine without aura, with intractable migraine, so stated, with status migrainosus 03/07/2020   Raynaud's disease    Ingrown toenail 01/20/2020   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   Hemorrhoids 10/13/2018   History of colon polyps 10/13/2018   Cognitive complaints 05/14/2018   Other insomnia 05/14/2018   Reactive airway disease 04/13/2018   Scleroderma ([place]) 04/13/2018   Fibromyalgia 04/13/2018   Gastroesophageal reflux disease without esophagitis 04/13/2018   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 10/28/2017   Renal cyst 08/18/2017   Spondylosis of lumbar region without myelopathy or radiculopathy 07/17/2017   Ischial bursitis of left side 07/01/2017   Sacroiliitis ([place]) 06/05/2017   Irritable bowel syndrome with both constipation and diarrhea 04/17/2017   Thrombocytopenia ([place]) 04/17/2017   Low back pain 04/28/2016   Lumbar radiculopathy 04/28/2016   Acute confusional migraine, refractory 07/21/2014   Migraine without aura, not refractory 07/29/2013   Headache 04/28/2013   Intractable migraine 04/28/2013   Spondylolisthesis at L5-S1 level 11/29/2012   Mild major depression ([place])  10/16/2009   Encounter for screening for cardiovascular disorders 03/11/2009   Essential hypertension 10/08/2006   Raynaud's phenomenon 10/08/2006   Raynaud's syndrome without gangrene 10/08/2006   Allergic rhinitis 10/16/2001   Migraine 10/16/2001    Verdene Lennert, CCC-SLP 05/16/2021, 4:34 PM  [facility]. [place], [place], [address] Phone: [phone]   Fax:  [phone]   Name: Jennifer Reed MRN: [id number] Date of Birth: 1965/10/12

## 2021-05-22 NOTE — Telephone Encounter (Signed)
Submitted PA request to [employer] via designated fax form, they responded stating they are unable to locate patient in their system.

## 2021-05-28 ENCOUNTER — Telehealth: Payer: Self-pay

## 2021-05-28 ENCOUNTER — Encounter: Payer: Self-pay | Admitting: Neurology

## 2021-05-28 NOTE — Telephone Encounter (Signed)
We received a PA renewal request from [employer]. I have completed the form and given to Dr. Lucia Gaskins to sign.

## 2021-05-28 NOTE — Telephone Encounter (Signed)
[employer] PA sheet signed by Dr Jaynee Eagles then faxed to [employer] one source. Received a receipt of confirmation.

## 2021-05-29 ENCOUNTER — Ambulatory Visit: Payer: Managed Care, Other (non HMO) | Admitting: Speech Pathology

## 2021-05-30 ENCOUNTER — Encounter: Payer: Self-pay | Admitting: Neurology

## 2021-05-31 ENCOUNTER — Encounter: Payer: Self-pay | Admitting: Neurology

## 2021-06-03 NOTE — Telephone Encounter (Signed)
See other mychart encounter. Pt sent this message on 1/20 and Dr Jaynee Eagles messaged her back on 1/21.

## 2021-06-05 ENCOUNTER — Ambulatory Visit: Payer: Managed Care, Other (non HMO) | Admitting: Speech Pathology

## 2021-06-06 ENCOUNTER — Telehealth: Payer: Self-pay | Admitting: Neurology

## 2021-06-06 NOTE — Telephone Encounter (Signed)
Completed [employer] PA form for Botox. Placed in Nurse Pod for MD signature. °

## 2021-06-06 NOTE — Telephone Encounter (Deleted)
Completed [employer] PA form for Botox. Placed in Nurse Pod for MD signature.

## 2021-06-07 ENCOUNTER — Other Ambulatory Visit: Payer: Self-pay | Admitting: Neurology

## 2021-06-10 ENCOUNTER — Ambulatory Visit: Payer: Managed Care, Other (non HMO) | Admitting: Podiatry

## 2021-06-11 NOTE — Progress Notes (Signed)
Consent Form Botulism Toxin Injection For Chronic Migraine  06/12/2021: Patient here for transfer of care. She also saw [facility] and was diagnosed with ADHD, is doing better with migraines and currently titrating off of Topiramate, stopped doxepin, trying to get off of any medication that can cause cognitive problems which is great. Return in 12 weeks.  Reviewed orally with patient, additionally signature is on file:  Botulism toxin has been approved by the Federal drug administration for treatment of chronic migraine. Botulism toxin does not cure chronic migraine and it may not be effective in some patients.  The administration of botulism toxin is accomplished by injecting a small amount of toxin into the muscles of the neck and head. Dosage must be titrated for each individual. Any benefits resulting from botulism toxin tend to wear off after 3 months with a repeat injection required if benefit is to be maintained. Injections are usually done every 3-4 months with maximum effect peak achieved by about 2 or 3 weeks. Botulism toxin is expensive and you should be sure of what costs you will incur resulting from the injection.  The side effects of botulism toxin use for chronic migraine may include:   -Transient, and usually mild, facial weakness with facial injections  -Transient, and usually mild, head or neck weakness with head/neck injections  -Reduction or loss of forehead facial animation due to forehead muscle weakness  -Eyelid drooping  -Dry eye  -Pain at the site of injection or bruising at the site of injection  -Double vision  -Potential unknown long term risks  Contraindications: You should not have Botox if you are pregnant, nursing, allergic to albumin, have an infection, skin condition, or muscle weakness at the site of the injection, or have myasthenia gravis, Lambert-Eaton syndrome, or ALS.  It is also possible that as with any injection, there may be an  allergic reaction or no effect from the medication. Reduced effectiveness after repeated injections is sometimes seen and rarely infection at the injection site may occur. All care will be taken to prevent these side effects. If therapy is given over a long time, atrophy and wasting in the muscle injected may occur. Occasionally the patient's become refractory to treatment because they develop antibodies to the toxin. In this event, therapy needs to be modified.  I have read the above information and consent to the administration of botulism toxin.    BOTOX PROCEDURE NOTE FOR MIGRAINE HEADACHE    Contraindications and precautions discussed with patient(above). Aseptic procedure was observed and patient tolerated procedure. Procedure performed by Dr. Georgia Dom  The condition has existed for more than 6 months, and pt does not have a diagnosis of ALS, Myasthenia Gravis or Lambert-Eaton Syndrome.  Risks and benefits of injections discussed and pt agrees to proceed with the procedure.  Written consent obtained  These injections are medically necessary. Pt  receives good benefits from these injections. These injections do not cause sedations or hallucinations which the oral therapies may cause.  Description of procedure:  The patient was placed in a sitting position. The standard protocol was used for Botox as follows, with 5 units of Botox injected at each site:   -Procerus muscle, midline injection  -Corrugator muscle, bilateral injection  -Frontalis muscle, bilateral injection, with 2 sites each side, medial injection was performed in the upper one third of the frontalis muscle, in the region vertical from the medial inferior edge of the superior orbital rim. The lateral injection was again in the  upper one third of the forehead vertically above the lateral limbus of the cornea, 1.5 cm lateral to the medial injection site.  -Temporalis muscle injection, 4 sites, bilaterally. The first  injection was 3 cm above the tragus of the ear, second injection site was 1.5 cm to 3 cm up from the first injection site in line with the tragus of the ear. The third injection site was 1.5-3 cm forward between the first 2 injection sites. The fourth injection site was 1.5 cm posterior to the second injection site.   -Occipitalis muscle injection, 3 sites, bilaterally. The first injection was done one half way between the occipital protuberance and the tip of the mastoid process behind the ear. The second injection site was done lateral and superior to the first, 1 fingerbreadth from the first injection. The third injection site was 1 fingerbreadth superiorly and medially from the first injection site.  -Cervical paraspinal muscle injection, 2 sites, bilateral knee first injection site was 1 cm from the midline of the cervical spine, 3 cm inferior to the lower border of the occipital protuberance. The second injection site was 1.5 cm superiorly and laterally to the first injection site.  -Trapezius muscle injection was performed at 3 sites, bilaterally. The first injection site was in the upper trapezius muscle halfway between the inflection point of the neck, and the acromion. The second injection site was one half way between the acromion and the first injection site. The third injection was done between the first injection site and the inflection point of the neck.   Will return for repeat injection in 3 months.   155 units of Botox was used, 45u Botox not injected was wasted. The patient tolerated the procedure well, there were no complications of the above procedure.

## 2021-06-12 ENCOUNTER — Ambulatory Visit (INDEPENDENT_AMBULATORY_CARE_PROVIDER_SITE_OTHER): Payer: Managed Care, Other (non HMO) | Admitting: Neurology

## 2021-06-12 ENCOUNTER — Other Ambulatory Visit: Payer: Self-pay

## 2021-06-12 DIAGNOSIS — G43711 Chronic migraine without aura, intractable, with status migrainosus: Secondary | ICD-10-CM

## 2021-06-12 NOTE — Progress Notes (Deleted)
Subjective:    Patient ID: Jennifer Reed is a 56 y.o. female.  HPI {Common ambulatory SmartLinks:[id number]}  Review of Systems  Objective:  Neurological Exam  Physical Exam  Assessment:   ***  Plan:   ***

## 2021-06-12 NOTE — Progress Notes (Signed)
Botox- 200 units x 1 vial Lot: [id number] Expiration: 08/2023 NDC: 6270-3500-93  Sodium Chloride- 60mL total Lot: [id number] Expiration: 06/12/2022 NDC: 3716-9678-93  Dx: Y10.175 S/P

## 2021-06-13 NOTE — Telephone Encounter (Signed)
Faxed signed PA form with OV notes to [employer].

## 2021-06-15 ENCOUNTER — Encounter: Payer: Self-pay | Admitting: Neurology

## 2021-06-26 DIAGNOSIS — Z8616 Personal history of COVID-19: Secondary | ICD-10-CM | POA: Insufficient documentation

## 2021-06-27 DIAGNOSIS — Z79899 Other long term (current) drug therapy: Secondary | ICD-10-CM | POA: Insufficient documentation

## 2021-06-27 DIAGNOSIS — Z90710 Acquired absence of both cervix and uterus: Secondary | ICD-10-CM | POA: Insufficient documentation

## 2021-06-27 DIAGNOSIS — M3500 Sicca syndrome, unspecified: Secondary | ICD-10-CM | POA: Insufficient documentation

## 2021-06-28 DIAGNOSIS — E785 Hyperlipidemia, unspecified: Secondary | ICD-10-CM | POA: Insufficient documentation

## 2021-06-28 DIAGNOSIS — K2970 Gastritis, unspecified, without bleeding: Secondary | ICD-10-CM | POA: Insufficient documentation

## 2021-06-30 ENCOUNTER — Other Ambulatory Visit: Payer: Self-pay | Admitting: Neurology

## 2021-07-02 ENCOUNTER — Encounter: Payer: Self-pay | Admitting: Cardiology

## 2021-07-02 ENCOUNTER — Ambulatory Visit (INDEPENDENT_AMBULATORY_CARE_PROVIDER_SITE_OTHER): Payer: Managed Care, Other (non HMO) | Admitting: Cardiology

## 2021-07-02 ENCOUNTER — Other Ambulatory Visit: Payer: Self-pay

## 2021-07-02 VITALS — BP 128/86 | HR 100 | Ht 64.5 in | Wt 124.0 lb

## 2021-07-02 DIAGNOSIS — F419 Anxiety disorder, unspecified: Secondary | ICD-10-CM | POA: Diagnosis not present

## 2021-07-02 DIAGNOSIS — I10 Essential (primary) hypertension: Secondary | ICD-10-CM

## 2021-07-02 DIAGNOSIS — I7300 Raynaud's syndrome without gangrene: Secondary | ICD-10-CM | POA: Diagnosis not present

## 2021-07-02 NOTE — Progress Notes (Signed)
Cardiology Office Note:    Date:  07/02/2021   ID:  Jennifer Reed, DOB Feb 08, 1966, MRN [id number]  PCP:  Adrienne Mocha, PA (Inactive)  Cardiologist:  Gypsy Balsam, MD    Referring MD: Adrienne Mocha, PA   No chief complaint on file. Doing well denies have any cardiac complaints  History of Present Illness:    Jennifer Reed is a 56 y.o. female with past medical history significant for essential hypertension, dyslipidemia, with normal phenomenon.  She comes today to my office for follow-up overall she seems to be somewhat hyperactive today.  She talks a lot.  Overall seems to be doing feeling well.  She changed her primary care physician and a physician that her cholesterol which was elevated she was offered to take cholesterol medication but she does not want to.  She thinks that her cholesterol is elevated because she did not have it before she get her cholesterol down.  Denies have any chest pain tightness squeezing pressure burning chest no palpitations no dizziness.  Past Medical History:  Diagnosis Date   Acute confusional migraine, refractory 07/21/2014   Allergic rhinitis 10/16/2001   Rhinitis Allergic  NOS Rhinitis Allergic  NOS  Formatting of this note might be different from the original. Overview:  Rhinitis Allergic  NOS   Allergies    Anxiety 05/07/2020   Chronic migraine without aura, with intractable migraine, so stated, with status migrainosus 03/07/2020   Cognitive complaints 05/14/2018   COVID-19 long hauler manifesting chronic concentration deficit 06/04/2020   Dry eye syndrome    Encounter for screening for cardiovascular disorders 03/11/2009   Essential hypertension 10/08/2006   (Problem list name updated by automated process. Provider to review and confirm.) (Problem list name updated by automated process. Provider to review and confirm.)  Formatting of this note might be different from the original. Overview:  (Problem list name updated by automated process.  Provider to review and confirm.)   Excessive daytime sleepiness 06/04/2020   Fibromyalgia    Gastritis    Gastroesophageal reflux disease without esophagitis 04/13/2018   GERD (gastroesophageal reflux disease)    Headache 04/28/2013   Problem list name updated by automated process. Provider to review Problem list name updated by automated process. Provider to review  Formatting of this note might be different from the original. Overview:  Problem list name updated by automated process. Provider to review   Hemorrhoids 10/13/2018   High blood pressure    History of colon polyps 10/13/2018   IBS (irritable bowel syndrome)    Ingrown toenail 01/20/2020   Intractable migraine 04/28/2013   Formatting of this note might be different from the original. Overview:  Problem list name updated by automated process. Provider to review  Formatting of this note might be different from the original. Overview:  updating diagnosis code for icd10 cutover   Ischial bursitis of left side 07/01/2017   Added automatically from request for surgery [id number]   Low back pain 04/28/2016   Lumbar radiculopathy 04/28/2016   Migraine 10/16/2001   Problem list name updated by automated process. Provider to review Problem list name updated by automated process. Provider to review  updating diagnosis code for icd10 cutover updating diagnosis code for icd10 cutover  updating diagnosis code for icd10 cutover updating diagnosis code for icd10 cutover  Migraine Without Aura Migraine Without Aura  Formatting of this note might be different from th   Migraine without aura, not refractory 07/29/2013   Formatting of this note might  be different from the original. 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 Provider to review and confirm.) (Problem list name updated by automated process. Provider to review and confirm.)   Raynaud's syndrome without gangrene 10/08/2006   Formatting of this note might be different from the original. Overview:  (Problem list name updated by automated process. 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   Botulinum Toxin Type A (BOTOX) 200 units SOLR Provider to inject 155 units into the muscles of the head and neck every 3 months. Discard remainder.   Erenumab-aooe (AIMOVIG) 140 MG/ML SOAJ Inject 140 mg into the skin every 30 (thirty) days.   losartan (COZAAR) 25 MG tablet TAKE 1 TABLET (25 MG TOTAL) BY MOUTH DAILY.   metroNIDAZOLE (METROCREAM) 0.75 % cream Apply 1 application topically 2 (two) times daily.   pregabalin (LYRICA) 150 MG capsule Take 150 mg by mouth 3 (three) times daily.   Rimegepant Sulfate (NURTEC) 75 MG TBDP Take 75 mg by mouth every other day. For migraines.   SAVELLA 50 MG TABS tablet Take 50 mg by mouth 2 (two) times daily.   sertraline  (ZOLOFT) 100 MG tablet Take 100 mg by mouth daily.   topiramate (TOPAMAX) 50 MG tablet Use with 25 mg tablets (as instructed for taper of medication) (Patient taking differently: 25 mg. Use with 25 mg tablets (as instructed for taper of medication))   tretinoin (RETIN-A) 0.05 % cream Apply 1 application topically at bedtime.   UBRELVY 100 MG TABS Take 100 mg by mouth daily.   zolpidem (AMBIEN CR) 12.5 MG CR tablet Take 12.5 mg by mouth at bedtime as needed for sleep.     Allergies:   Codeine, Gluten meal, Lac bovis, Other, Amlodipine besylate, Milk-related compounds, and Wheat bran   Social History   Socioeconomic History   Marital status: Married    Spouse name: Not on file   Number of children: 2   Years of education: Not on file   Highest education level: Not on file  Occupational History   Not on file  Tobacco Use   Smoking status: Never    Passive exposure: Never   Smokeless tobacco: Never  Vaping Use   Vaping Use: Never used  Substance and Sexual Activity   Alcohol use: Yes    Comment: social drinks  rarely ("like a cocktail a month")   Drug use: Never   Sexual activity: Not on file  Other Topics Concern   Not on file  Social History Narrative   Lives at home with husband and dog   Right handed   Caffeine: n/a    Social Determinants of Health   Financial Resource Strain: Not on file  Food Insecurity: Not on file  Transportation Needs: Not on file  Physical Activity: Not on file  Stress: Not on file  Social Connections: Not on file     Family History: The patient's family history includes Heart attack in her mother. ROS:   Please see the history of present illness.    All 14 point review of systems negative except as described per history of present illness  EKGs/Labs/Other Studies Reviewed:      Recent Labs: 12/14/2020: ALT 14; Hemoglobin 17.4; Platelets 186 12/31/2020: BUN 13; Creatinine, Ser 0.75; Potassium 4.1; Sodium 139  Recent Lipid Panel No  results found for: CHOL, TRIG, HDL, CHOLHDL, VLDL, LDLCALC, LDLDIRECT  Physical Exam:    VS:  BP 128/86 (BP Location: Right Arm)    Pulse 100    Ht 5' 4.5" (1.638 m)    Wt 124 lb (56.2 kg)    BMI 20.96 kg/m     Wt Readings from Last 3 Encounters:  07/02/21 124 lb (56.2 kg)  02/19/21 128 lb 3.2 oz (58.2 kg)  12/17/20 122 lb (55.3 kg)     GEN:  Well nourished, well developed in no acute distress HEENT: Normal NECK: No JVD; No carotid bruits LYMPHATICS: No lymphadenopathy CARDIAC: RRR, no murmurs, no rubs, no gallops RESPIRATORY:  Clear to auscultation without rales, wheezing or rhonchi  ABDOMEN: Soft, non-tender, non-distended MUSCULOSKELETAL:  No edema; No deformity  SKIN: Warm and dry LOWER EXTREMITIES: no swelling NEUROLOGIC:  Alert and oriented x 3 PSYCHIATRIC:  Normal affect   ASSESSMENT:    1. Essential hypertension   2. Raynaud's syndrome without gangrene   3. Anxiety    PLAN:    In order of problems listed above:  Essential hypertension appears to be relatively well controlled we will continue present management. Raynaud phenomena denies having any. Anxiety which is contributing to current symptomatology. Dyslipidemia, I did review blood work done by her primary care physician.  Her total cholesterol 271 HDL 70 LDL 181 I did do 10 years calculated risk predictor which showed very low only 2.7.  Based on that we do need to initiate statin however she need to exercise and good diet, therefore I discussed basic of Mediterranean diet with her and we will recheck her cholesterol in 6 months.  After admit I will still be surprised with low risks for CAD.   Medication Adjustments/Labs and Tests Ordered: Current medicines are reviewed at length with the patient today.  Concerns regarding medicines are outlined above.  Orders Placed This Encounter  Procedures   EKG 12-Lead   Medication changes: No orders of the defined types were placed in this  encounter.   Signed, Georgeanna Lea, MD, [facility] 07/02/2021 2:11 PM    [facility]

## 2021-07-02 NOTE — Patient Instructions (Signed)
Medication Instructions:  Your physician recommends that you continue on your current medications as directed. Please refer to the Current Medication list given to you today.  *If you need a refill on your cardiac medications before your next appointment, please call your pharmacy*   Lab Work: None If you have labs (blood work) drawn today and your tests are completely normal, you will receive your results only by: MyChart Message (if you have MyChart) OR A paper copy in the mail If you have any lab test that is abnormal or we need to change your treatment, we will call you to review the results.   Testing/Procedures: None   Follow-Up: At [facility], you and your health needs are our priority.  As part of our continuing mission to provide you with exceptional heart care, we have created designated Provider Care Teams.  These Care Teams include your primary Cardiologist (physician) and Advanced Practice Providers (APPs -  Physician Assistants and Nurse Practitioners) who all work together to provide you with the care you need, when you need it.  We recommend signing up for the patient portal called "MyChart".  Sign up information is provided on this After Visit Summary.  MyChart is used to connect with patients for Virtual Visits (Telemedicine).  Patients are able to view lab/test results, encounter notes, upcoming appointments, etc.  Non-urgent messages can be sent to your provider as well.   To learn more about what you can do with MyChart, go to [email or web address].    Your next appointment:   6 month(s)  The format for your next appointment:   In Person  Provider:       Other Instructions None

## 2021-07-03 DIAGNOSIS — R825 Elevated urine levels of drugs, medicaments and biological substances: Secondary | ICD-10-CM | POA: Insufficient documentation

## 2021-07-08 ENCOUNTER — Telehealth: Payer: Self-pay

## 2021-07-08 NOTE — Telephone Encounter (Signed)
I have submitted a PA request for Ubrelvy 100mg  on CMM, Key: [id number] - PA Case ID: .  Awaiting determination from [employer].

## 2021-07-13 ENCOUNTER — Other Ambulatory Visit: Payer: Self-pay | Admitting: Neurology

## 2021-07-14 ENCOUNTER — Encounter: Payer: Self-pay | Admitting: Neurology

## 2021-07-15 NOTE — Telephone Encounter (Signed)
Received fax from [employer]. Ubrelvy approved 07/08/21 through 07/08/22. I faxed approval to pharmacy and messaged pt. Received a receipt of confirmation. ? ?

## 2021-07-21 ENCOUNTER — Other Ambulatory Visit: Payer: Self-pay

## 2021-07-21 ENCOUNTER — Encounter (HOSPITAL_BASED_OUTPATIENT_CLINIC_OR_DEPARTMENT_OTHER): Payer: Self-pay | Admitting: *Deleted

## 2021-07-21 ENCOUNTER — Emergency Department (HOSPITAL_BASED_OUTPATIENT_CLINIC_OR_DEPARTMENT_OTHER)
Admission: EM | Admit: 2021-07-21 | Discharge: 2021-07-21 | Disposition: A | Discharge status: Home / Self Care | Payer: Managed Care, Other (non HMO) | Attending: Emergency Medicine | Admitting: Emergency Medicine

## 2021-07-21 DIAGNOSIS — S61211A Laceration without foreign body of left index finger without damage to nail, initial encounter: Secondary | ICD-10-CM | POA: Insufficient documentation

## 2021-07-21 DIAGNOSIS — Z79899 Other long term (current) drug therapy: Secondary | ICD-10-CM | POA: Insufficient documentation

## 2021-07-21 DIAGNOSIS — W275XXA Contact with paper-cutter, initial encounter: Secondary | ICD-10-CM | POA: Insufficient documentation

## 2021-07-21 NOTE — Discharge Instructions (Addendum)
Return to ED with any new or worsening signs or symptoms such as concern for infection ?Please read the attached informational guide concerning tissue adhesive wound care ?Please do not shower for the first 24 hours.  Please keep the area dry for 24 hours. ?

## 2021-07-21 NOTE — ED Provider Notes (Signed)
?[facility] EMERGENCY DEPARTMENT ?Provider Note ? ? ?CSN: [id number] ?Arrival date & time: 07/21/21  1854 ? ?  ? ?History ? ?Chief Complaint  ?Patient presents with  ? Laceration  ? ? ?Jennifer Reed is a 56 y.o. female with medical history of rosacea, anxiety, migraines, major depression.  Patient presents to ED for evaluation of finger laceration.  Patient states that she was using a box cutter to open moving boxes when the box of her slipped and she suffered a superficial laceration to her left index finger.  Patient states that she last received a tetanus shot in 2019.  Patient reports that she recently bought a box cutter and there is no rest or other contamination on the blade. ? ? ?Laceration ? ?  ? ?Home Medications ?Prior to Admission medications   ?Medication Sig Start Date End Date Taking? Authorizing Provider  ?ALPRAZolam (XANAX) 1 MG tablet Take 0.5 mg by mouth as needed for anxiety. 07/13/18   [provider]  ?Botulinum Toxin Type A (BOTOX) 200 units SOLR Provider to inject 155 units into the muscles of the head and neck every 3 months. Discard remainder. 04/24/21   Anson Fret, MD  ?Dorise Hiss (AIMOVIG) 140 MG/ML SOAJ Inject 140 mg into the skin every 30 (thirty) days. 02/19/21   Anson Fret, MD  ?losartan (COZAAR) 25 MG tablet TAKE 1 TABLET (25 MG TOTAL) BY MOUTH DAILY. 02/18/21 07/02/21  Georgeanna Lea, MD  ?metroNIDAZOLE (METROCREAM) 0.75 % cream Apply 1 application topically 2 (two) times daily. 12/05/19   [provider]  ?pregabalin (LYRICA) 150 MG capsule Take 150 mg by mouth 3 (three) times daily.    [provider]  ?Rimegepant Sulfate (NURTEC) 75 MG TBDP Take 75 mg by mouth every other day. For migraines. 02/19/21   Anson Fret, MD  ?SAVELLA 50 MG TABS tablet Take 50 mg by mouth 2 (two) times daily. 10/17/19   [provider]  ?sertraline (ZOLOFT) 100 MG tablet Take 100 mg by mouth daily.    [provider]  ?topiramate  (TOPAMAX) 50 MG tablet Use with 25 mg tablets (as instructed for taper of medication) ?Patient taking differently: 25 mg. Use with 25 mg tablets (as instructed for taper of medication) 06/13/21   Anson Fret, MD  ?tretinoin (RETIN-A) 0.05 % cream Apply 1 application topically at bedtime. 07/02/20   [provider]  ?UBRELVY 100 MG TABS Take 100 mg by mouth daily. 02/19/21   Anson Fret, MD  ?zolpidem (AMBIEN CR) 12.5 MG CR tablet Take 12.5 mg by mouth at bedtime as needed for sleep.    [provider]  ?   ? ?Allergies    ?Codeine, Gluten meal, Lac bovis, Other, Amlodipine besylate, Milk-related compounds, and Wheat bran   ? ?Review of Systems   ?Review of Systems  ?Skin:  Positive for wound.  ?All other systems reviewed and are negative. ? ?Physical Exam ?Updated Vital Signs ?BP (!) 122/94 (BP Location: Right Arm)   Pulse 94   Temp 98.2 ?F (36.8 ?C) (Oral)   Resp 16   Ht 5' 3.5" (1.613 m)   Wt 54.4 kg   SpO2 100%   BMI 20.92 kg/m?  ?Physical Exam ?Vitals and nursing note reviewed.  ?Constitutional:   ?   General: She is not in acute distress. ?   Appearance: Normal appearance. She is not ill-appearing, toxic-appearing or diaphoretic.  ?HENT:  ?   Head: Normocephalic and atraumatic.  ?  Nose: Nose normal. No congestion.  ?   Mouth/Throat:  ?   Mouth: Mucous membranes are moist.  ?   Pharynx: Oropharynx is clear.  ?Eyes:  ?   Extraocular Movements: Extraocular movements intact.  ?   Conjunctiva/sclera: Conjunctivae normal.  ?   Pupils: Pupils are equal, round, and reactive to light.  ?Cardiovascular:  ?   Rate and Rhythm: Normal rate and regular rhythm.  ?Pulmonary:  ?   Effort: Pulmonary effort is normal.  ?   Breath sounds: Normal breath sounds. No wheezing.  ?Abdominal:  ?   General: Abdomen is flat. Bowel sounds are normal.  ?   Palpations: Abdomen is soft.  ?Musculoskeletal:  ?   Cervical back: Normal range of motion and neck supple.  ?Skin: ?   General: Skin is warm and dry.  ?    Capillary Refill: Capillary refill takes more than 3 seconds.  ?   Findings: Laceration present.  ?   Comments: 2 cm superficial laceration to the patient left index finger.  Bleeding controlled.  No tendon involvement.  Patient neurovascularly intact throughout entire hand.  Patient has full function of hand.  ?Neurological:  ?   Mental Status: She is alert and oriented to person, place, and time.  ? ? ?ED Results / Procedures / Treatments   ?Labs ?(all labs ordered are listed, but only abnormal results are displayed) ?Labs Reviewed - No data to display ? ?EKG ?None ? ?Radiology ?No results found. ? ?Procedures ?Marland Kitchen..Laceration Repair ? ?Date/Time: 07/21/2021 8:08 PM ?Performed by: Al DecantGroce, Jerrick Farve F, PA-C ?Authorized by: Al DecantGroce, Hildegard Hlavac F, PA-C  ? ?Consent:  ?  Consent obtained:  Verbal ?  Consent given by:  Patient ?  Risks, benefits, and alternatives were discussed: yes   ?  Risks discussed:  Infection, poor cosmetic result, poor wound healing and need for additional repair ?  Alternatives discussed:  No treatment ?Universal protocol:  ?  Patient identity confirmed:  Verbally with patient and arm band ?Anesthesia:  ?  Anesthesia method:  None ?Laceration details:  ?  Location:  Finger ?  Finger location:  L index finger ?  Length (cm):  2 ?Pre-procedure details:  ?  Preparation:  Patient was prepped and draped in usual sterile fashion ?Exploration:  ?  Hemostasis achieved with:  Direct pressure ?  Imaging outcome: foreign body not noted   ?Treatment:  ?  Area cleansed with:  Soap and water ?  Amount of cleaning:  Standard ?  Debridement:  None ?Skin repair:  ?  Repair method:  Tissue adhesive ?Approximation:  ?  Approximation:  Close ?Repair type:  ?  Repair type:  Simple ?Post-procedure details:  ?  Dressing:  Bulky dressing ?  Procedure completion:  Tolerated well, no immediate complications  ? ? ?Medications Ordered in ED ?Medications - No data to display ? ?ED Course/ Medical Decision Making/ A&P ?  ?                         ?Medical Decision Making ? ?56 year old female presents to ED for evaluation of left index finger laceration.  Please see HPI for further details. ? ?On examination, the patient has 2 cm superficial laceration to her left index finger.  The bleeding is controlled.  No need for suture at this time.  Please see procedure note for further details of wound closure. ? ?Patient stable for discharge at this time.  Patient provided return precautions and  he voiced understanding.  Patient supplied with gauze.  Patient advised on how to treat for tissue adhesive wound closure injuries at home.  Patient had all of her questions answered to her satisfaction.  Patient stable. ? ? ?Final Clinical Impression(s) / ED Diagnoses ?Final diagnoses:  ?Laceration of left index finger without foreign body without damage to nail, initial encounter  ? ? ?Rx / DC Orders ?ED Discharge Orders   ? ? None  ? ?  ? ? ?  ?Al Decant, PA-C ?07/21/21 2010 ? ?  ?Gwyneth Sprout, MD ?07/21/21 2159 ? ?

## 2021-07-21 NOTE — ED Triage Notes (Signed)
Laceration to left index finger with box cutter ?

## 2021-07-25 MED ORDER — BOTOX 200 UNITS IJ SOLR
INTRAMUSCULAR | 1 refills | Status: DC
Start: 2021-07-25 — End: 2021-12-17

## 2021-07-25 NOTE — Telephone Encounter (Signed)
[facility] PA department @ [phone], spoke with [place] T she states existing PA is still some good PA # [phone] (02/06/2021-02/06/2022). ?

## 2021-07-25 NOTE — Telephone Encounter (Signed)
Please send Botox Rx refill to [facility]. ?

## 2021-07-25 NOTE — Addendum Note (Signed)
Addended by: Bertram Savin on: 07/25/2021 01:01 PM ? ? Modules accepted: Orders ? ?

## 2021-07-31 NOTE — Telephone Encounter (Signed)
Received 1 200 unit vial of Botox from [facility]. ?

## 2021-08-12 ENCOUNTER — Encounter: Payer: Self-pay | Admitting: Neurology

## 2021-08-12 ENCOUNTER — Telehealth: Payer: Self-pay | Admitting: Neurology

## 2021-08-12 NOTE — Telephone Encounter (Signed)
Pt sent a detailed mychart message. Will address there.  ?

## 2021-08-12 NOTE — Telephone Encounter (Signed)
Pt states she feels she is worsening while in this process of coming off of the topiramate (TOPAMAX) 50 MG tablet , pt is having more headaches and is experiencing shaking and other side effects, please call. ?

## 2021-08-13 MED ORDER — TOPIRAMATE 50 MG PO TABS: 100.0000 mg | ORAL_TABLET | Freq: Two times a day (BID) | ORAL | 0 refills | Status: DC

## 2021-08-13 NOTE — Addendum Note (Signed)
Addended by: Raynald Kemp A on: 08/13/2021 09:50 AM ? ? Modules accepted: Orders ? ?

## 2021-08-22 ENCOUNTER — Other Ambulatory Visit: Payer: Self-pay

## 2021-08-22 ENCOUNTER — Encounter: Payer: Self-pay | Admitting: Cardiology

## 2021-08-22 ENCOUNTER — Other Ambulatory Visit: Payer: Self-pay | Admitting: Cardiology

## 2021-08-22 MED ORDER — LOSARTAN POTASSIUM 25 MG PO TABS: 25.0000 mg | ORAL_TABLET | Freq: Every day | ORAL | 3 refills | Status: DC

## 2021-09-04 ENCOUNTER — Ambulatory Visit (INDEPENDENT_AMBULATORY_CARE_PROVIDER_SITE_OTHER): Payer: 59 | Admitting: Neurology

## 2021-09-04 ENCOUNTER — Telehealth: Payer: Self-pay | Admitting: Neurology

## 2021-09-04 DIAGNOSIS — G43711 Chronic migraine without aura, intractable, with status migrainosus: Secondary | ICD-10-CM

## 2021-09-04 NOTE — Progress Notes (Signed)
Botox- 200 units x 1 vial ?Lot: [id number] ?Expiration: 01/2024 ?NDC: 3716-9678-93 ? ?0.9% Sodium Chloride- 87mL total ?Lot: [id number] ?Expiration: 06/12/2022 ?NDC: 1025-8527-78 ? ?Dx: E42.353 ?S/P  ?

## 2021-09-04 NOTE — Progress Notes (Addendum)
Consent Form Botulism Toxin Injection For Chronic Migraine  Addendum 01/01/2022: ormal neurocognitive testing by Dr. 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  He counseled patient, and we will see her back in 2 years.  At this time no more from neurology as far as her cognitive difficulties.  See scanned. Results reviewwed with patient with Dr. Roseanne Reno.  09/04/2021: stable, doing well with migraines.  06/12/2021: Patient here for transfer of care. She also saw [facility] and was diagnosed with ADHD, is doing better with migraines and currently titrating off of Topiramate, stopped doxepin, trying to get off of any medication that can cause cognitive problems which is great. Return in 12 weeks.  Reviewed orally with patient, additionally signature is on file:  Botulism toxin has been approved by the Federal drug administration for treatment of chronic migraine. Botulism toxin does not cure chronic migraine and it may not be effective in some patients.  The administration of botulism toxin is accomplished by injecting a small amount of toxin into the muscles of the neck and head. Dosage must be titrated for each individual. Any benefits resulting from botulism toxin tend to wear off after 3 months with a repeat injection required if benefit is to be maintained. Injections are usually done every 3-4 months with maximum effect peak achieved by about 2 or 3 weeks. Botulism toxin is expensive and you should be sure of what costs you will incur resulting from the injection.  The side effects of botulism toxin use for chronic migraine may include:   -Transient, and usually mild, facial weakness with facial  injections  -Transient, and usually mild, head or neck weakness with head/neck injections  -Reduction or loss of forehead facial animation due to forehead muscle weakness  -Eyelid drooping  -Dry eye  -Pain at the site of injection or bruising at the site of injection  -Double vision  -Potential unknown long term risks  Contraindications: You should not have Botox if you are pregnant, nursing, allergic to albumin, have an infection, skin condition, or muscle weakness at the site of the injection, or have myasthenia gravis, Lambert-Eaton syndrome, or ALS.  It is also possible that as with any injection, there may be an allergic reaction or no effect from the medication. Reduced effectiveness after repeated injections is sometimes seen and rarely infection at the injection site may occur. All care will be taken to prevent these side effects. If therapy is given over a long time, atrophy and wasting in the muscle injected may occur. Occasionally the patient's become refractory to treatment because they develop antibodies to the toxin. In this event, therapy needs to be modified.  I have read the above information and consent to the administration of botulism toxin.    BOTOX PROCEDURE NOTE FOR MIGRAINE HEADACHE    Contraindications and precautions discussed with patient(above). Aseptic procedure was observed and patient tolerated procedure. Procedure performed by Dr. Artemio Aly  The condition has existed for more than 6 months, and pt does not have a diagnosis of ALS, Myasthenia Gravis or Lambert-Eaton Syndrome.  Risks and benefits of injections discussed and pt agrees to proceed with the procedure.  Written consent obtained  These injections are medically necessary. Pt  receives good benefits from these injections. These injections do not cause sedations or hallucinations  which the oral therapies may cause.  Description of procedure:  The patient was placed in a sitting position. The  standard protocol was used for Botox as follows, with 5 units of Botox injected at each site:   -Procerus muscle, midline injection  -Corrugator muscle, bilateral injection  -Frontalis muscle, bilateral injection, with 2 sites each side, medial injection was performed in the upper one third of the frontalis muscle, in the region vertical from the medial inferior edge of the superior orbital rim. The lateral injection was again in the upper one third of the forehead vertically above the lateral limbus of the cornea, 1.5 cm lateral to the medial injection site.  -Temporalis muscle injection, 4 sites, bilaterally. The first injection was 3 cm above the tragus of the ear, second injection site was 1.5 cm to 3 cm up from the first injection site in line with the tragus of the ear. The third injection site was 1.5-3 cm forward between the first 2 injection sites. The fourth injection site was 1.5 cm posterior to the second injection site.   -Occipitalis muscle injection, 3 sites, bilaterally. The first injection was done one half way between the occipital protuberance and the tip of the mastoid process behind the ear. The second injection site was done lateral and superior to the first, 1 fingerbreadth from the first injection. The third injection site was 1 fingerbreadth superiorly and medially from the first injection site.  -Cervical paraspinal muscle injection, 2 sites, bilateral knee first injection site was 1 cm from the midline of the cervical spine, 3 cm inferior to the lower border of the occipital protuberance. The second injection site was 1.5 cm superiorly and laterally to the first injection site.  -Trapezius muscle injection was performed at 3 sites, bilaterally. The first injection site was in the upper trapezius muscle halfway between the inflection point of the neck, and the acromion. The second injection site was one half way between the acromion and the first injection site. The third  injection was done between the first injection site and the inflection point of the neck.   Will return for repeat injection in 3 months.   155 units of Botox was used, 45u Botox not injected was wasted. The patient tolerated the procedure well, there were no complications of the above procedure.

## 2021-09-04 NOTE — Telephone Encounter (Signed)
Patient was seen today and she wanted to let you know that she got an appointment June 27th at 9am with Dr Junius Finner Voout Roseanne Reno. If you can ensure the referral get sent over as soon an possible it would be greatly appreciated. Thanks ?

## 2021-09-05 DIAGNOSIS — H52223 Regular astigmatism, bilateral: Secondary | ICD-10-CM | POA: Insufficient documentation

## 2021-09-05 DIAGNOSIS — H25013 Cortical age-related cataract, bilateral: Secondary | ICD-10-CM | POA: Insufficient documentation

## 2021-09-05 DIAGNOSIS — H16101 Unspecified superficial keratitis, right eye: Secondary | ICD-10-CM | POA: Insufficient documentation

## 2021-09-05 DIAGNOSIS — H524 Presbyopia: Secondary | ICD-10-CM | POA: Insufficient documentation

## 2021-09-05 DIAGNOSIS — H04123 Dry eye syndrome of bilateral lacrimal glands: Secondary | ICD-10-CM | POA: Insufficient documentation

## 2021-09-05 DIAGNOSIS — H5213 Myopia, bilateral: Secondary | ICD-10-CM | POA: Insufficient documentation

## 2021-09-10 ENCOUNTER — Telehealth: Payer: Self-pay | Admitting: Neurology

## 2021-09-10 NOTE — Telephone Encounter (Signed)
Referral for Neuropsychology sent to Dr. Peter Stewart [phone]. ?

## 2021-10-04 ENCOUNTER — Encounter: Payer: Self-pay | Admitting: Neurology

## 2021-10-08 ENCOUNTER — Other Ambulatory Visit: Payer: Self-pay | Admitting: Neurology

## 2021-10-08 NOTE — Telephone Encounter (Signed)
Per Dr Lucia Gaskins ok to go back to 100mg  twice daily

## 2021-10-09 ENCOUNTER — Other Ambulatory Visit: Payer: Self-pay | Admitting: Neurology

## 2021-10-09 ENCOUNTER — Other Ambulatory Visit: Payer: Self-pay | Admitting: *Deleted

## 2021-10-17 NOTE — Telephone Encounter (Signed)
Received (1) 200 unit vial of Botox from [facility]. 

## 2021-11-19 DIAGNOSIS — R41840 Attention and concentration deficit: Secondary | ICD-10-CM | POA: Insufficient documentation

## 2021-11-26 ENCOUNTER — Ambulatory Visit: Payer: 59 | Admitting: Podiatry

## 2021-11-28 DIAGNOSIS — G479 Sleep disorder, unspecified: Secondary | ICD-10-CM | POA: Insufficient documentation

## 2021-12-03 ENCOUNTER — Ambulatory Visit: Payer: 59 | Admitting: Adult Health

## 2021-12-03 ENCOUNTER — Ambulatory Visit: Payer: 59 | Admitting: Neurology

## 2021-12-17 ENCOUNTER — Ambulatory Visit (INDEPENDENT_AMBULATORY_CARE_PROVIDER_SITE_OTHER): Payer: 59 | Admitting: Podiatry

## 2021-12-17 DIAGNOSIS — L600 Ingrowing nail: Secondary | ICD-10-CM | POA: Diagnosis not present

## 2021-12-17 NOTE — Progress Notes (Signed)
Subjective:   Patient ID: Jennifer Reed, female   DOB: 56 y.o.   MRN: [id number]   HPI Chief Complaint  Patient presents with   Ingrown Toenail    Pt came in for bilateral ingrown toenail on the hallux, and nail fungus, redness and some pain, started year ago, pt went to the nail salon to have the ingrown nail cut out     56 year old female presents to the office today with the above complaints.  Denies any drainage or pus.  She has no other concerns today.   Review of Systems  All other systems reviewed and are negative.  Past Medical History:  Diagnosis Date   Acute confusional migraine, refractory 07/21/2014   Allergic rhinitis 10/16/2001   Rhinitis Allergic  NOS Rhinitis Allergic  NOS  Formatting of this note might be different from the original. Overview:  Rhinitis Allergic  NOS   Allergies    Anxiety 05/07/2020   Chronic migraine without aura, with intractable migraine, so stated, with status migrainosus 03/07/2020   Cognitive complaints 05/14/2018   COVID-19 long hauler manifesting chronic concentration deficit 06/04/2020   Dry eye syndrome    Encounter for screening for cardiovascular disorders 03/11/2009   Essential hypertension 10/08/2006   (Problem list name updated by automated process. Provider to review and confirm.) (Problem list name updated by automated process. Provider to review and confirm.)  Formatting of this note might be different from the original. Overview:  (Problem list name updated by automated process. Provider to review and confirm.)   Excessive daytime sleepiness 06/04/2020   Fibromyalgia    Gastritis    Gastroesophageal reflux disease without esophagitis 04/13/2018   GERD (gastroesophageal reflux disease)    Headache 04/28/2013   Problem list name updated by automated process. Provider to review Problem list name updated by automated process. Provider to review  Formatting of this note might be different from the original. Overview:  Problem list  name updated by automated process. Provider to review   Hemorrhoids 10/13/2018   High blood pressure    History of colon polyps 10/13/2018   IBS (irritable bowel syndrome)    Ingrown toenail 01/20/2020   Intractable migraine 04/28/2013   Formatting of this note might be different from the original. Overview:  Problem list name updated by automated process. Provider to review  Formatting of this note might be different from the original. Overview:  updating diagnosis code for icd10 cutover   Ischial bursitis of left side 07/01/2017   Added automatically from request for surgery [id number]   Low back pain 04/28/2016   Lumbar radiculopathy 04/28/2016   Migraine 10/16/2001   Problem list name updated by automated process. Provider to review Problem list name updated by automated process. Provider to review  updating diagnosis code for icd10 cutover updating diagnosis code for icd10 cutover  updating diagnosis code for icd10 cutover updating diagnosis code for icd10 cutover  Migraine Without Aura Migraine Without Aura  Formatting of this note might be different from th   Migraine without aura, not refractory 07/29/2013   Formatting of this note might be different from the original. Overview:  updating diagnosis code for icd10 cutover   Migraines    Mild major depression (HCC) 10/16/2009   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   Morning headache 08/02/2020   Raynaud's disease    Raynaud's phenomenon 10/08/2006   (Problem list name updated by automated process. Provider to review and confirm.) (Problem list name updated by automated process. Provider to  review and confirm.)   Raynaud's syndrome without gangrene 10/08/2006   Formatting of this note might be different from the original. Overview:  (Problem list name updated by automated process. 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   Wolff-Parkinson-White syndrome     Past Surgical History:  Procedure Laterality Date   ABDOMINAL HYSTERECTOMY     BACK SURGERY     lumbar fusion   CESAREAN SECTION  1999 and 1998   NECK SURGERY     PARTIAL HYSTERECTOMY       Current Outpatient Medications:    ALPRAZolam (XANAX) 1 MG tablet, Take 0.5 mg by mouth as needed for anxiety., Disp: , Rfl:    Erenumab-aooe (AIMOVIG) 140 MG/ML SOAJ, Inject 140 mg into the skin every 30 (thirty) days., Disp: 1 mL, Rfl: 11   losartan (COZAAR) 25 MG tablet, Take 1 tablet (25 mg total) by mouth daily., Disp: 90 tablet, Rfl: 3   metroNIDAZOLE (METROCREAM) 0.75 % cream, Apply 1 application topically 2 (two) times daily., Disp: , Rfl:    pregabalin (LYRICA) 150 MG capsule, Take 150 mg by mouth 3 (three) times daily., Disp: , Rfl:    Rimegepant Sulfate (NURTEC) 75 MG TBDP, Take 75 mg by mouth every other day. For migraines., Disp: 16 tablet, Rfl: 11   SAVELLA 50 MG TABS tablet, Take 50 mg by mouth 2 (two) times daily., Disp: , Rfl:    topiramate (TOPAMAX) 100 MG tablet, TAKE 1 TABLET BY MOUTH TWICE A DAY, Disp: 180 tablet, Rfl: 1   tretinoin (RETIN-A) 0.05 % cream, Apply 1 application topically at bedtime., Disp: , Rfl:    UBRELVY 100 MG TABS, Take 100 mg by mouth daily., Disp: 16 tablet, Rfl: 11  Allergies  Allergen Reactions   Codeine Nausea And Vomiting and Other (See Comments)   Gluten Meal Other (See Comments)   Milk (Cow) Other (See Comments)    GI Upset GI Upset    Other Rash     Adhesives give a rash   Amlodipine Besylate Other (See Comments)   Milk-Related Compounds    Wheat Bran Diarrhea           Objective:  Physical Exam  General: AAO x3, NAD  Dermatological: There is mild incurvation present to the hallux toenails as well as the fourth digit toenail with faint edema but there is no significant erythema, ascending cellulitis but there is no drainage or pus.  There is no open lesions or signs of abscess today.  Vascular: Dorsalis Pedis artery and Posterior Tibial artery pedal pulses are 2/4 bilateral with immedate capillary fill time. There is no pain with calf compression, swelling, warmth, erythema.   Neruologic: Grossly intact via light touch bilateral.   Musculoskeletal: Mild tenderness palpation of the distal portion of the ingrown toenails.  No other areas of discomfort.  Muscular strength 5/5 in all groups tested bilateral.  Gait: Unassisted, Nonantalgic.       Assessment:   Toenails bilaterally  Plan:  -Treatment options discussed including all alternatives, risks, and complications -Etiology of symptoms were discussed -Discussed partial nail avulsion versus conservative care.  Ultimate decided to proceed with conservative care today and I sharply debrided the corners without any complications or bleeding and there was resolution of pain afterwards.  Monitor for any signs or symptoms of infection.  Recommend Epsom salt soaks.  Should symptoms continue or worsen will proceed with partial nail avulsion.  Vivi Barrack DPM

## 2021-12-17 NOTE — Patient Instructions (Signed)

## 2021-12-19 ENCOUNTER — Telehealth: Payer: Self-pay

## 2021-12-19 ENCOUNTER — Telehealth: Payer: Self-pay | Admitting: Cardiology

## 2021-12-19 DIAGNOSIS — Z79899 Other long term (current) drug therapy: Secondary | ICD-10-CM

## 2021-12-19 NOTE — Telephone Encounter (Signed)
Lipid panel ordered at pt request for appointment

## 2021-12-19 NOTE — Telephone Encounter (Signed)
Patient is calling requesting and order be placed for her to have a Lipid panel drawn tomorrow morning. She is requesting a callback or mychart message notifying her once the order is placed. Please advise.

## 2021-12-20 NOTE — Telephone Encounter (Signed)
Lab req sent to lab for lipid panel

## 2021-12-21 LAB — LIPID PANEL
Chol/HDL Ratio: 2.6 ratio (ref 0.0–4.4)
Cholesterol, Total: 228 mg/dL — ABNORMAL HIGH (ref 100–199)
HDL: 88 mg/dL (ref >39)
LDL Chol Calc (NIH): 130 mg/dL — ABNORMAL HIGH (ref 0–99)
Triglycerides: 57 mg/dL (ref 0–149)
VLDL Cholesterol Cal: 10 mg/dL (ref 5–40)

## 2021-12-24 ENCOUNTER — Encounter: Payer: Self-pay | Admitting: Neurology

## 2021-12-24 MED ORDER — TOPIRAMATE 100 MG PO TABS: 100.0000 mg | ORAL_TABLET | Freq: Two times a day (BID) | ORAL | 0 refills | Status: DC

## 2021-12-27 ENCOUNTER — Ambulatory Visit: Payer: 59 | Admitting: Cardiology

## 2021-12-27 VITALS — BP 108/78 | HR 100 | Ht 63.0 in | Wt 132.0 lb

## 2021-12-27 DIAGNOSIS — E785 Hyperlipidemia, unspecified: Secondary | ICD-10-CM | POA: Diagnosis not present

## 2021-12-27 DIAGNOSIS — I7300 Raynaud's syndrome without gangrene: Secondary | ICD-10-CM | POA: Diagnosis not present

## 2021-12-27 DIAGNOSIS — I10 Essential (primary) hypertension: Secondary | ICD-10-CM

## 2021-12-27 NOTE — Progress Notes (Unsigned)
Cardiology Office Note:    Date:  12/27/2021   ID:  Jennifer Reed, DOB 1966-04-17, MRN [id number]  PCP:  Wilfrid Lund, PA  Cardiologist:  Gypsy Balsam, MD    Referring MD: Wilfrid Lund, [place]   Chief Complaint  Patient presents with   Follow-up  Doing well  History of Present Illness:    Jennifer Reed is a 56 y.o. female with past medical history significant for essential hypertension, anxiety, dyslipidemia, Raynaud's phenomenon.  She comes today to my office for follow-up.  Overall doing better.  Her blood pressure seems to be well controlled, she also got fasting lipid profile done and her LDL actually dropped to 130.  She denies have any chest pain tightness squeezing pressure burning chest no palpitations dizziness swelling of lower extremities.  Past Medical History:  Diagnosis Date   Acute confusional migraine, refractory 07/21/2014   Allergic rhinitis 10/16/2001   Rhinitis Allergic  NOS Rhinitis Allergic  NOS  Formatting of this note might be different from the original. Overview:  Rhinitis Allergic  NOS   Allergies    Anxiety 05/07/2020   Chronic migraine without aura, with intractable migraine, so stated, with status migrainosus 03/07/2020   Cognitive complaints 05/14/2018   COVID-19 long hauler manifesting chronic concentration deficit 06/04/2020   Dry eye syndrome    Encounter for screening for cardiovascular disorders 03/11/2009   Essential hypertension 10/08/2006   (Problem list name updated by automated process. Provider to review and confirm.) (Problem list name updated by automated process. Provider to review and confirm.)  Formatting of this note might be different from the original. Overview:  (Problem list name updated by automated process. Provider to review and confirm.)   Excessive daytime sleepiness 06/04/2020   Fibromyalgia    Gastritis    Gastroesophageal reflux disease without esophagitis 04/13/2018   GERD (gastroesophageal reflux disease)    Headache  04/28/2013   Problem list name updated by automated process. Provider to review Problem list name updated by automated process. Provider to review  Formatting of this note might be different from the original. Overview:  Problem list name updated by automated process. Provider to review   Hemorrhoids 10/13/2018   High blood pressure    History of colon polyps 10/13/2018   IBS (irritable bowel syndrome)    Ingrown toenail 01/20/2020   Intractable migraine 04/28/2013   Formatting of this note might be different from the original. Overview:  Problem list name updated by automated process. Provider to review  Formatting of this note might be different from the original. Overview:  updating diagnosis code for icd10 cutover   Ischial bursitis of left side 07/01/2017   Added automatically from request for surgery [id number]   Low back pain 04/28/2016   Lumbar radiculopathy 04/28/2016   Migraine 10/16/2001   Problem list name updated by automated process. Provider to review Problem list name updated by automated process. Provider to review  updating diagnosis code for icd10 cutover updating diagnosis code for icd10 cutover  updating diagnosis code for icd10 cutover updating diagnosis code for icd10 cutover  Migraine Without Aura Migraine Without Aura  Formatting of this note might be different from th   Migraine without aura, not refractory 07/29/2013   Formatting of this note might be different from the original. Overview:  updating diagnosis code for icd10 cutover   Migraines    Mild major depression (HCC) 10/16/2009   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   Morning headache 08/02/2020   Raynaud's  disease    Raynaud's phenomenon 10/08/2006   (Problem list name updated by automated process. Provider to review and confirm.) (Problem list name updated by automated process. Provider to review and confirm.)   Raynaud's syndrome without gangrene 10/08/2006   Formatting of this note might be  different from the original. Overview:  (Problem list name updated by automated process. 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   buPROPion (WELLBUTRIN XL) 150 MG 24 hr tablet Take 150 mg by mouth daily.   celecoxib (CELEBREX) 200 MG capsule Take 200 mg by mouth daily.   Erenumab-aooe (AIMOVIG) 140 MG/ML SOAJ Inject 140 mg into the skin every 30 (thirty) days.   estradiol (ESTRACE) 1 MG tablet Take 0.5 mg by mouth daily.   losartan (COZAAR) 25 MG tablet Take 1 tablet (25 mg total) by mouth daily.   metroNIDAZOLE (METROCREAM) 0.75 % cream Apply 1 application topically 2 (two) times daily.   pregabalin (LYRICA) 150 MG capsule Take 150 mg by mouth 3 (three) times daily.   Rimegepant Sulfate (NURTEC) 75 MG TBDP Take 75 mg by mouth every other day. For migraines.   SAVELLA 50 MG TABS tablet Take 50 mg by mouth 2 (two) times daily.   topiramate (TOPAMAX) 100 MG tablet Take 1 tablet (100 mg total) by mouth 2 (two) times daily.   tretinoin (RETIN-A) 0.05 % cream Apply 1 application  topically at bedtime.   UBRELVY 100 MG TABS Take 100 mg by mouth daily.     Allergies:   Codeine, Gluten meal, Milk (cow), Other, Amlodipine besylate, Milk-related compounds, and Wheat bran   Social History   Socioeconomic History   Marital status: Married    Spouse name: Not on file   Number of children: 2   Years of education: Not on file   Highest education level: Not on file  Occupational History   Not on file  Tobacco Use   Smoking status: Never    Passive exposure: Never   Smokeless tobacco: Never  Vaping Use   Vaping Use: Never used  Substance and Sexual Activity   Alcohol use: Yes    Comment: social drinks rarely ("like a cocktail a month")   Drug use: Never   Sexual activity: Not on file  Other Topics Concern   Not on file  Social History Narrative   Lives at home with husband and dog   Right handed   Caffeine: n/a    Social Determinants of Health   Financial Resource Strain: Not on file  Food Insecurity: Not on  file  Transportation Needs: Not on file  Physical Activity: Not on file  Stress: Not on file  Social Connections: Not on file     Family History: The patient's family history includes Heart attack in her mother. ROS:   Please see the history of present illness.    All 14 point review of systems negative except as described per history of present illness  EKGs/Labs/Other Studies Reviewed:      Recent Labs: 12/31/2020: BUN 13; Creatinine, Ser 0.75; Potassium 4.1; Sodium 139  Recent Lipid Panel    Component Value Date/Time   CHOL 228 (H) 12/20/2021 1131   TRIG 57 12/20/2021 1131   HDL 88 12/20/2021 1131   CHOLHDL 2.6 12/20/2021 1131   LDLCALC 130 (H) 12/20/2021 1131    Physical Exam:    VS:  BP 108/78 (BP Location: Right Arm, Patient Position: Sitting)   Pulse 100   Ht 5\' 3"  (1.6 m)   Wt 132 lb (59.9 kg)   SpO2 99%   BMI 23.38 kg/m     Wt Readings from Last 3 Encounters:  12/27/21 132 lb (59.9 kg)  07/21/21 120 lb (54.4 kg)   07/02/21 124 lb (56.2 kg)     GEN:  Well nourished, well developed in no acute distress HEENT: Normal NECK: No JVD; No carotid bruits LYMPHATICS: No lymphadenopathy CARDIAC: RRR, no murmurs, no rubs, no gallops RESPIRATORY:  Clear to auscultation without rales, wheezing or rhonchi  ABDOMEN: Soft, non-tender, non-distended MUSCULOSKELETAL:  No edema; No deformity  SKIN: Warm and dry LOWER EXTREMITIES: no swelling NEUROLOGIC:  Alert and oriented x 3 PSYCHIATRIC:  Normal affect   ASSESSMENT:    1. Essential hypertension   2. Raynaud's syndrome without gangrene   3. Dyslipidemia    PLAN:    In order of problems listed above:  Essential hypertension but well controlled continue present management. Raynaud's phenomenon.  Stable denies have any recent episodes Dyslipidemia I did review her K PN which show me her total cholesterol of 228 LDL 130 HDL 88.  I did calculate her 10 years predicted risk for her clinical scenario which is only 0.9.  It is a low risk no need to initiate therapy however we had discussion about exercises and eating right.   Medication Adjustments/Labs and Tests Ordered: Current medicines are reviewed at length with the patient today.  Concerns regarding medicines are outlined above.  No orders of the defined types were placed in this encounter.  Medication changes: No orders of the defined types were placed in this encounter.   Signed, 07/04/21, MD, [facility] 12/27/2021 1:41 PM    [facility]

## 2021-12-27 NOTE — Patient Instructions (Signed)

## 2022-01-01 ENCOUNTER — Telehealth: Payer: Self-pay | Admitting: Neurology

## 2022-01-01 NOTE — Telephone Encounter (Signed)
formal neurocognitive testing by Dr. 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  He counseled patient, and we will see her back in 2 years.  At this time no more from neurology as far as her cognitive difficulties.  See scanned. Results reviewwed with patient with Dr. Roseanne Reno.

## 2022-01-11 ENCOUNTER — Other Ambulatory Visit: Payer: Self-pay | Admitting: Neurology

## 2022-01-11 DIAGNOSIS — G43711 Chronic migraine without aura, intractable, with status migrainosus: Secondary | ICD-10-CM

## 2022-01-20 ENCOUNTER — Telehealth: Payer: Self-pay

## 2022-01-20 DIAGNOSIS — G43001 Migraine without aura, not intractable, with status migrainosus: Secondary | ICD-10-CM

## 2022-01-20 NOTE — Telephone Encounter (Signed)
A PA for Aimovig 140MG /ML auto-injectors has been started on CMM. Key #: [id number]. It is awaiting review.

## 2022-01-20 NOTE — Telephone Encounter (Signed)
The PA for Aimovig 140MG /ML auto-injector has been denied. It cannot be used in combination with another prophylactic Calcitonin Gene-Related Peptide Antagonists (Nurtec).

## 2022-01-25 ENCOUNTER — Encounter: Payer: Self-pay | Admitting: Neurology

## 2022-01-27 MED ORDER — NURTEC 75 MG PO TBDP: ORAL_TABLET | ORAL | 11 refills | Status: DC

## 2022-01-27 NOTE — Addendum Note (Signed)
Addended by: Brandon Melnick on: 01/27/2022 04:52 PM   Modules accepted: Orders

## 2022-01-27 NOTE — Telephone Encounter (Signed)
I sent the patient a mychart message to discuss her options with her. If the patient agrees we may try to change Nurtec to prn. I am waiting for the patient's response.

## 2022-02-03 NOTE — Telephone Encounter (Signed)
[facility] after attempting on CMM (would not allow as had another in process).  I called [phone] spoke to [facility], and did a reconsideration of the denial for aimovig 140mg  (as pt now taking the nurtec as needed for acute migraine).  PA # [id number] (sent to clinical team for review) and will let us know.

## 2022-02-04 ENCOUNTER — Telehealth: Payer: Self-pay

## 2022-02-04 NOTE — Telephone Encounter (Signed)
Jennifer Reed Key: [id number] - PA Case ID: [id number]  Outcome: Approvedtoday PA Case: [id number], Status: Approved, Coverage Starts on: 02/04/2022 12:00 AM, Coverage Ends on: 02/05/2023 12:00 AM. Questions? Contact [phone].

## 2022-02-04 NOTE — Telephone Encounter (Addendum)
PA  for Nurtec 75MG  dispersible tablets submitted via CMM.  Key: [id number] If [facility] has not replied within 72 hours for urgent requests and up to 15 days for standard requests, please contact [facility] at [phone]. Awaiting determination

## 2022-02-04 NOTE — Telephone Encounter (Signed)
approval for coverage for Aimovig received via fax 02/03/22-02/04/23. Pt is aware

## 2022-03-04 ENCOUNTER — Encounter: Payer: Self-pay | Admitting: Neurology

## 2022-03-12 ENCOUNTER — Other Ambulatory Visit: Payer: Self-pay | Admitting: Neurology

## 2022-03-12 DIAGNOSIS — G43001 Migraine without aura, not intractable, with status migrainosus: Secondary | ICD-10-CM

## 2022-03-13 ENCOUNTER — Other Ambulatory Visit: Payer: Self-pay | Admitting: *Deleted

## 2022-03-13 ENCOUNTER — Other Ambulatory Visit: Payer: Self-pay | Admitting: Neurology

## 2022-03-13 ENCOUNTER — Encounter: Payer: Self-pay | Admitting: Neurology

## 2022-03-13 DIAGNOSIS — G43001 Migraine without aura, not intractable, with status migrainosus: Secondary | ICD-10-CM

## 2022-03-13 MED ORDER — TOPIRAMATE 100 MG PO TABS: 100.0000 mg | ORAL_TABLET | Freq: Two times a day (BID) | ORAL | 0 refills | Status: AC

## 2022-03-18 MED ORDER — UBRELVY 100 MG PO TABS: 100.0000 mg | ORAL_TABLET | ORAL | 11 refills | Status: DC | PRN

## 2022-03-18 NOTE — Addendum Note (Signed)
Addended by: Gildardo Griffes on: 03/18/2022 04:16 PM   Modules accepted: Orders

## 2022-03-25 ENCOUNTER — Telehealth: Payer: Self-pay | Admitting: Neurology

## 2022-03-25 NOTE — Telephone Encounter (Signed)
LVM and sent mychart msg informing pt of need to reschedule 12/14 appointment - MD out 

## 2022-04-17 LAB — LAB REPORT - SCANNED: EGFR: 82

## 2022-04-24 ENCOUNTER — Ambulatory Visit: Payer: 59 | Admitting: Neurology

## 2022-04-24 IMAGING — CR DG ABDOMEN ACUTE W/ 1V CHEST
3 series · 3 of 3 positions shown · non-contrast
Comparison: None.

CLINICAL DATA: EGD yesterday, not feeling well, chills headache and
abdominal pain.

EXAM:
DG ABDOMEN ACUTE WITH 1 VIEW CHEST

[w chest pa]
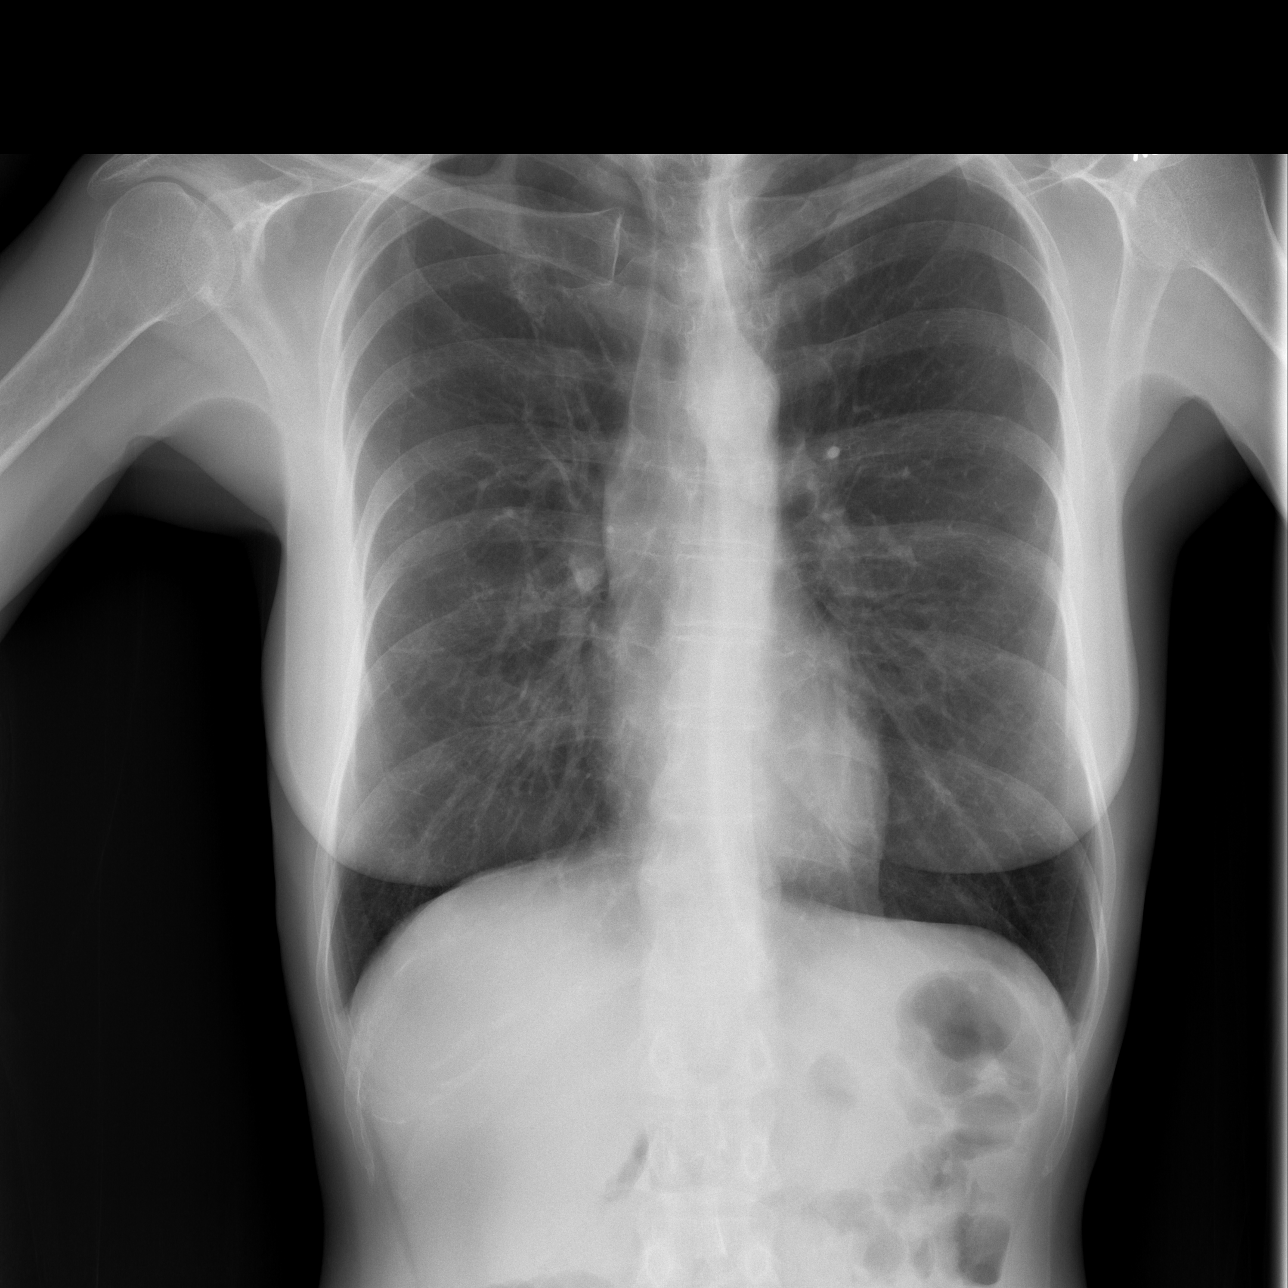

[w abdomen upright]
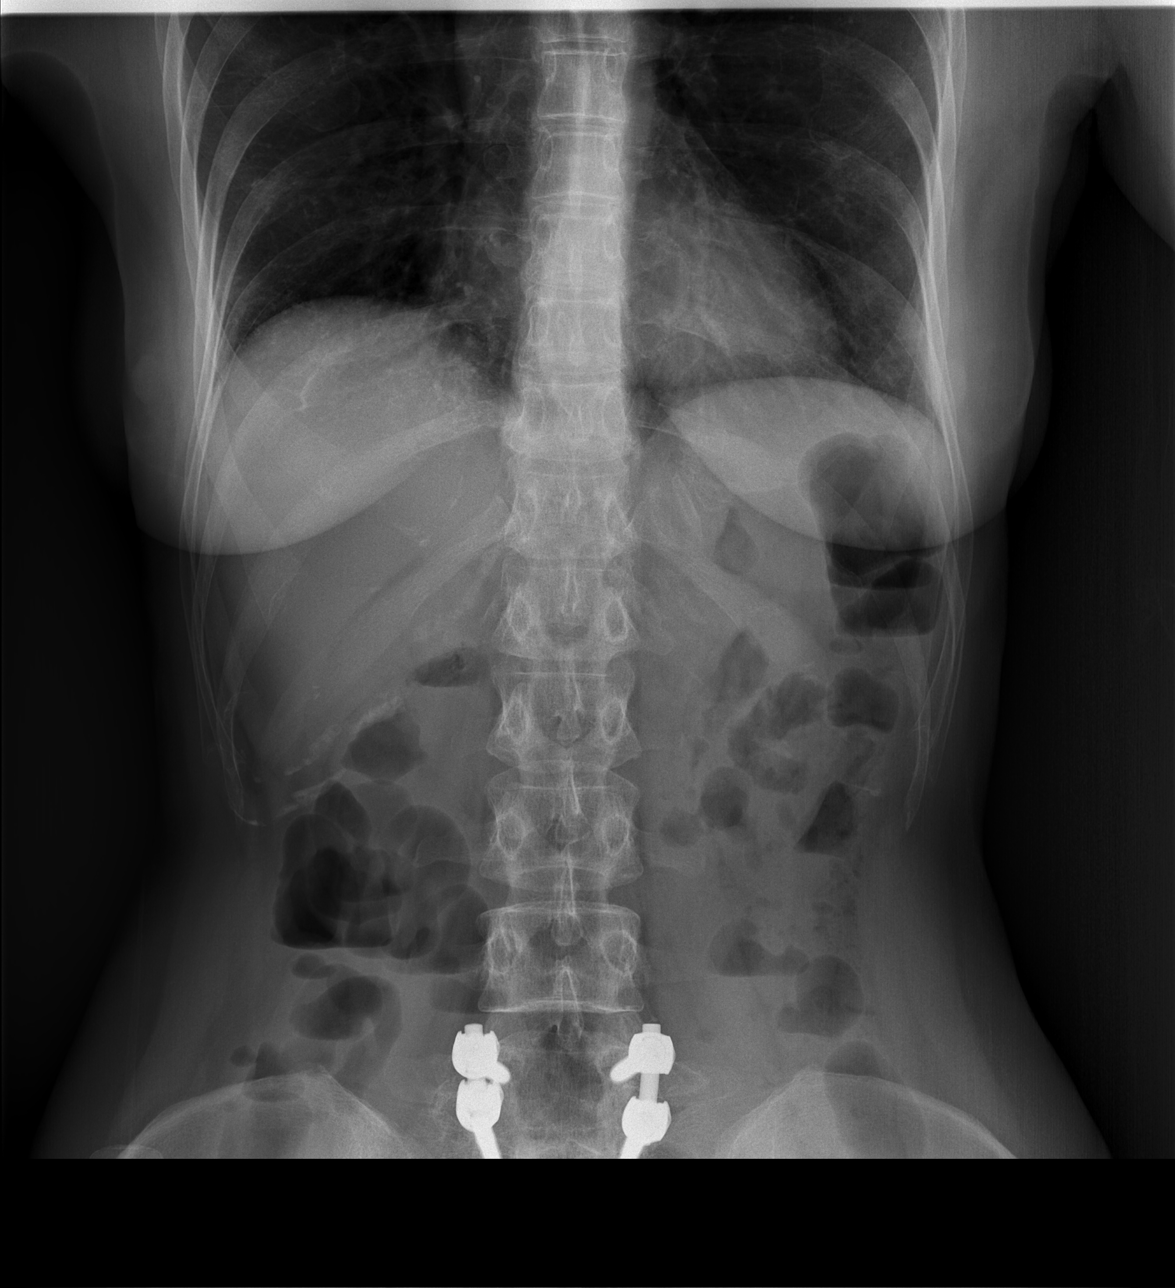

[t abdomen supine]
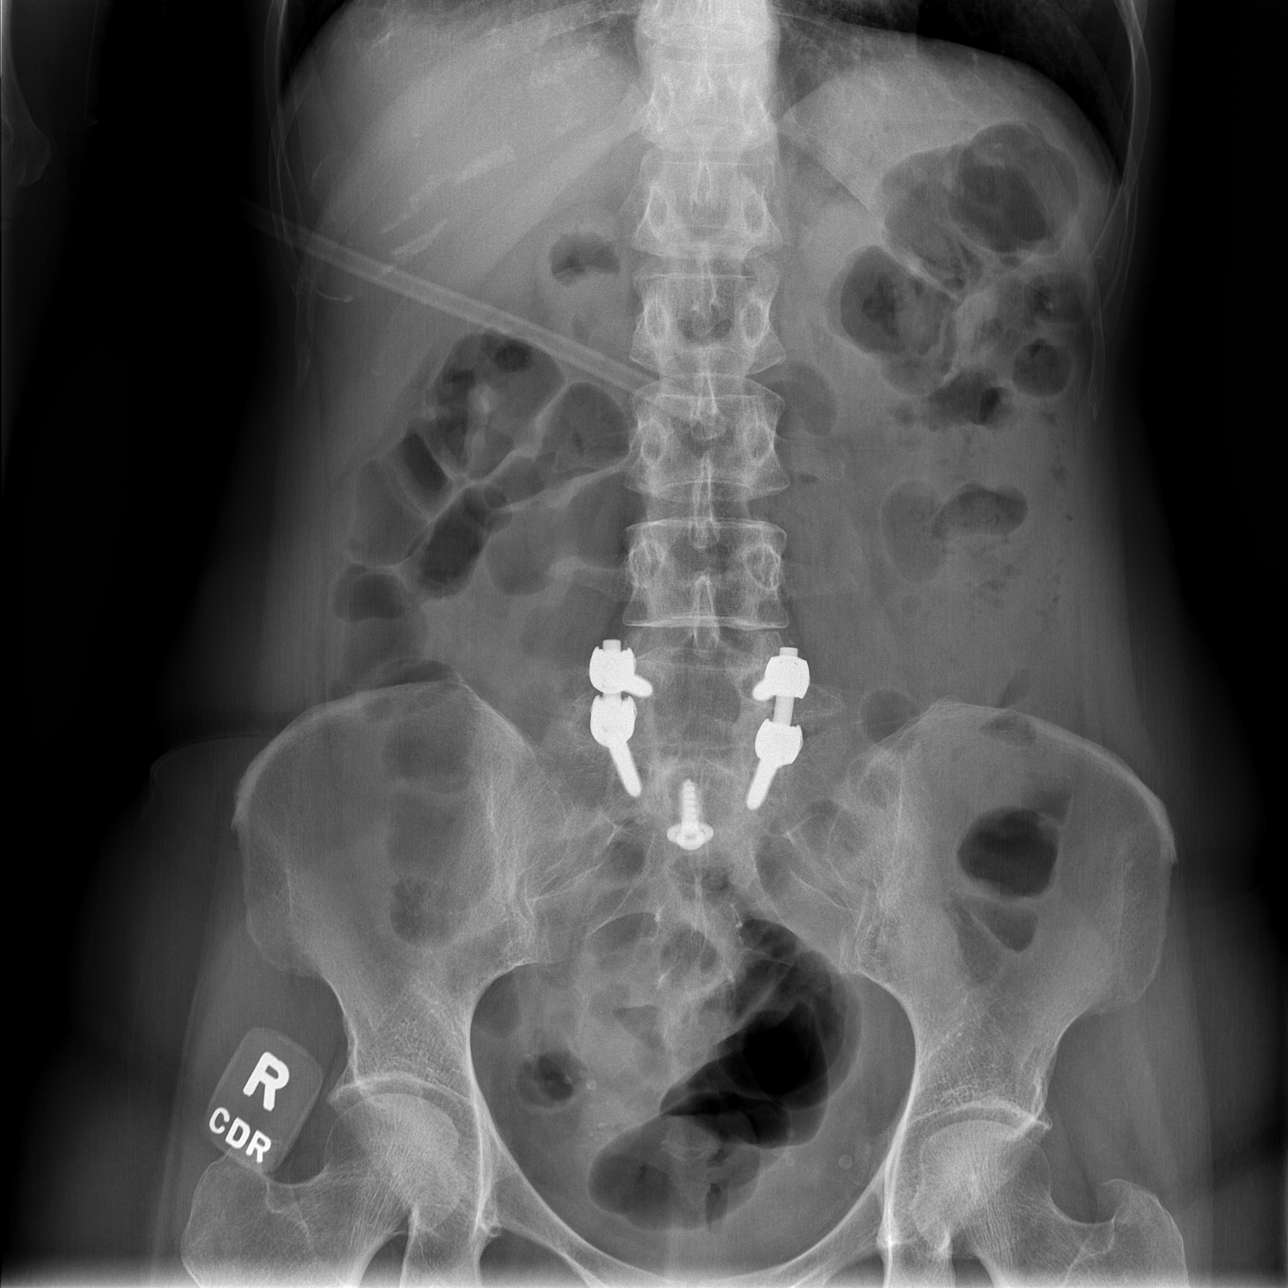

[3 of 3 positions shown; findings below may reference images not displayed]

FINDINGS: There is no evidence of bowel obstruction. There is no evidence of
free intraperitoneal gas. Normal cardiomediastinal silhouette. No
focal airspace consolidation. No large pleural effusion or visible
pneumothorax. There is no acute osseous abnormality. L5-S1 fusion
partially visualized is intact.
IMPRESSION: Negative abdominal radiographs.  No acute cardiopulmonary disease.

## 2022-04-28 ENCOUNTER — Encounter: Payer: Self-pay | Admitting: Neurology

## 2022-05-06 ENCOUNTER — Ambulatory Visit: Payer: 59 | Admitting: Neurology

## 2022-05-26 ENCOUNTER — Encounter: Payer: Self-pay | Admitting: Neurology

## 2022-05-28 NOTE — Telephone Encounter (Signed)
Spoke with Patient will send [place] her new insurance card when she receives it  so we can get a approval for Botox . Pt states will send copy of new insurance card  through [place]. Pt expressed understanding and thanked me for calling

## 2022-06-05 ENCOUNTER — Other Ambulatory Visit: Payer: Self-pay | Admitting: *Deleted

## 2022-06-05 NOTE — Telephone Encounter (Signed)
LVM for patient to call back to discuss Nurtec Refill

## 2022-06-05 NOTE — Telephone Encounter (Signed)
New insurance information below for patient forwarded information to PA team for Approval for Botox .Pt will wait to discuss Nurtec with Dr Jaynee Eagles at her next office visit     [employer] Member Id: [id number] Group Number: [id number] Eff Date: 05/27/2022 Phone #: [phone]   Jennifer Reed Apr 07, 1966 [id number]

## 2022-06-17 ENCOUNTER — Encounter: Payer: Self-pay | Admitting: Neurology

## 2022-06-17 ENCOUNTER — Ambulatory Visit (INDEPENDENT_AMBULATORY_CARE_PROVIDER_SITE_OTHER): Payer: 59 | Admitting: Neurology

## 2022-06-17 VITALS — BP 133/94 | HR 86 | Ht 63.5 in | Wt 140.4 lb

## 2022-06-17 DIAGNOSIS — M79602 Pain in left arm: Secondary | ICD-10-CM

## 2022-06-17 DIAGNOSIS — G43709 Chronic migraine without aura, not intractable, without status migrainosus: Secondary | ICD-10-CM | POA: Diagnosis not present

## 2022-06-17 DIAGNOSIS — M542 Cervicalgia: Secondary | ICD-10-CM

## 2022-06-17 DIAGNOSIS — M4802 Spinal stenosis, cervical region: Secondary | ICD-10-CM

## 2022-06-17 DIAGNOSIS — M5412 Radiculopathy, cervical region: Secondary | ICD-10-CM

## 2022-06-17 DIAGNOSIS — G959 Disease of spinal cord, unspecified: Secondary | ICD-10-CM

## 2022-06-17 DIAGNOSIS — G1229 Other motor neuron disease: Secondary | ICD-10-CM

## 2022-06-17 DIAGNOSIS — G8929 Other chronic pain: Secondary | ICD-10-CM

## 2022-06-17 DIAGNOSIS — R29898 Other symptoms and signs involving the musculoskeletal system: Secondary | ICD-10-CM

## 2022-06-17 DIAGNOSIS — Z9181 History of falling: Secondary | ICD-10-CM | POA: Diagnosis not present

## 2022-06-17 DIAGNOSIS — R270 Ataxia, unspecified: Secondary | ICD-10-CM | POA: Diagnosis not present

## 2022-06-17 DIAGNOSIS — R258 Other abnormal involuntary movements: Secondary | ICD-10-CM

## 2022-06-17 DIAGNOSIS — M79601 Pain in right arm: Secondary | ICD-10-CM

## 2022-06-17 NOTE — Progress Notes (Addendum)
[facility]    Provider:  Dr Lucia Gaskins Requesting Provider: Wilfrid Lund, PA Primary Care Provider:  Wilfrid Lund, PA  CC:  migraines and memory loss  Addedndum 09/04/2022: gave patient some Ajovy samples to "bridge" her until we got botox up and running until we stated her on botox. Now she keeps emailing asking for Ajovy. I've tried to explain she hasn't had 3 botox yet and also most insurances won't pay for botox and ajovy and in fact in her chart she was denied for aimovig and botox at last neurologist. I will prescribe Ajovy, she asked me to try again, she states she has the required # of migraines and headaches and I explained that she has to have a 50% reduction in botox to keep botox. I'll prescribe ajovy and ask her to please use mychrt responsibly.  06/17/2022: She has been to sleep clinic on Monday. She was diagnosed with possible adhd. She went to [place] [facility]. 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 Next time ifneeded [place] Tailored brain health we can try if needed. She had CTS right hand corrected.  waking up with headaches (sleep study completed in past and seeing another sleep specialist upcoming). Migraines happen on the right, frequently nocturnal.  For the last 6 months migraines >10 a month and > 15 total headache days a month. No aura. No medication overuse. Tried and failed multiple medications.  Grains are all over the head or unilateral can radiate to the neck, pulsating pounding throbbing, photophobia phonophobia, nausea, hurts to move, darkroom helps, can last 24 to 72 hours and has had headaches last even longer than that, moderate to severe, affecting her life, ongoing for at least the last 6 months at this severity and frequency.No other focal neurologic deficits, associated symptoms, inciting events or modifiable factors.  Neck pain: Severe canal stenosis, arm weakness and numbness, imbalance and ataxia, MRi cervical spine asap, fallen.   MRI  cervical spine 01/2022: Degenerative changes:    C1-C2:  No substantial canal or foraminal stenosis.  C2-C3:  No substantial canal or foraminal stenosis.  C3-C4:  Minimal posteriorly directed disc osteophyte complex substantial canal stenosis. Mild left neural foraminal narrowing related to uncovertebral and facet hypertrophy.  C4-C5:  Posteriorly directed disc osteophyte complex without substantial canal stenosis. Bilateral uncovertebral and facet spurring contribute to mild right greater than left neural foraminal narrowing.  C5-C6:  Posteriorly directed disc osteophyte complex narrows the ventral CSF space without contributing to substantial canal stenosis. Mild left neural foraminal narrowing related to uncovertebral and facet hypertrophy.  C6-C7:  Posteriorly directed disc osteophyte complex mildly narrows the ventral CSF space without contributing to substantial canal stenosis. No substantial neural foraminal narrowing.  C7-T1:  No substantial canal or foraminal stenosis.   tried Flexeril, tizanidine, Topamax, Trokendi, gabapentin, Lyrica, amitriptyline, Celexa, Lexapro, fluoxetine, Paxil, Zoloft, Cymbalta, Savella, doxepin, Aimovig, Botox, dry needling, yoga, acupuncture, trigger point injections and Botox, Maxalt, Fioricet, propranolol, amlodipine,  nurtec, ubrelvy and aimovig.botox   Addendum 01/01/2022: formal neurocognitive testing by Dr. 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  He counseled patient, and we will see her back in 2 years.  At this time no more from neurology as far as her cognitive difficulties.  See scanned. Results reviewwed with patient with Dr. Roseanne Reno.  09/04/2021:  stable, doing well with migraines.   06/12/2021: Patient here for transfer of care. She also saw [facility] and was diagnosed with ADHD, is doing better with migraines and currently titrating off of Topiramate, stopped doxepin, trying to get off of any medication that can cause cognitive problems which is great. Return in 12 weeks.  Addendum 01/01/2022: ormal neurocognitive testing by Dr. 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  He counseled patient, and we will see her back in 2 years.  At this time no more from neurology as far as her cognitive difficulties.  See scanned. Results reviewwed with patient with Dr. Roseanne Reno.  09/04/2021: stable, doing well with migraines.   06/12/2021: Patient here for transfer of care. She also saw [facility] and was diagnosed with ADHD, is doing better with migraines and currently titrating off of Topiramate, stopped doxepin, trying to get off of any medication that can cause cognitive problems which is great. Return in 12 weeks.  Addendum 09/09/2021; this is a patient with pressured speech, tangential, very distractible, she has been complaining of inattention and memory issues, I sent her for evaluation for ADHD at [facility] and I received a note back from Dr. Elisabeth Most was stated "not in attention or ADHD issue.  I believe her issue was with memory and cognitively not processing information, short-term working memory.  Stimulus's have caused side effects and she will not be able to tolerate.  She worries but Wilhemena Durie will not work and risk of adding another serotonin medication.  Recommend neurocognitive testing a specific evaluation for memory" I do wonder if patient is bipolar or has  other psychiatric issue causing this hypomania I am not really sure, we will refer her to Dr. Clayborn Heron, she already has an appointment.  Addendum 06/01/2021: Email from patient that she has to wait months to see an adhd specialist and has to wait to get medication that may help her if she has adhd. We are sorry about that but I am not sure patient has ADHD but I think she needs to be evaluated for it.  Even though we suspect possible concomitant adhd, there are a lot of other things that may be causing her symptoms.,she is on a lot of medications including Topamax, Lyrica, Ambien, doxepin, benzodiazepine and she needs help with her insomnia which can also cause memory/inattention issues. I don;t think it is just that easy as she needs ADHD medication, She needs a comprehensive evaluation of her current medications, formal eval for ADHD and help with insomnia which is why we sent her to a [facility] center.  She can call her insurance and get a list of doctors in the area that manage or may specialize in adhd and we would be happy to refer her elsewhere. She can also speak with her primary care doctor. We will continue to help her with her migraines.   February 19, 2021: Patient is here and as far as her migraines go she has feeling awesome.Nurtec qod working great, Arts development officer working great for acute. Aimovig working well. Headaches doing so well, she is thrilled.  We reviewed the MRI of her brain today, personally reviewed images and agree, unremarkable for age and medical conditions.  Here today for short-term memory issues, she will jump around, can't  focus, procrastinate, she detours a lot. Since Covid 2 years ago she has not read a book, she had covid, these may be all long-covid symptoms. She has insomnia and we discussed all the medications she is on. Discussed titrating slowly. Normal cognitive aging.    We had a very long talk today I think that her concerns are due to  multifactorial issues including: polypharmacy including Topamax, Lyrica, Ambien, doxepin, benzodiazepine and multiple other medications.  I think she also has untreated ADHD.  Also discussed normal cognitive aging.  Given her insomnia I think that is more behavioral I think she needs a sleep therapist and we had a very long discussion about proper sleep etiquette.  I recommended that maybe we start by slowly titrating off Topamax, if she needs 25 mg pills happy to give it to her, she can go see "[facility] attention specialist" has ADHD not only needs medication but also compensatory skills and it appears this is a multidisciplinary center for ADHD, then when she can get some help with insomnia we can start taking her off some of the medications she takes for sleeping.  I think she will feel much better.  In the meantime we will continue to treat her migraines and transition her here for Botox.   IMPRESSION: This MRI of the brain with and without contrast shows the following: 1.  Scattered T2/FLAIR hyperintense foci in the subcortical and deep white matter of the hemispheres.  This is a nonspecific finding and most likely represents minimal chronic microvascular ischemic changes or sequela of migraine headaches.  None of the foci enhance or appear to be acute. 2.   Left frontal developmental venous anomaly. 3.   No acute findings.     HPI:  Jennifer Reed is a 57 y.o. female here as requested by Wilfrid Lund, PA for migraines.  Past medical history migraine without aura, major depressive disorder recurrent moderate, scleroderma, left renal mass, rosacea, fibromyalgia, anxiety, Raynaud's phenomena, primary insomnia.  I reviewed [name]'s notes: It appears patient is already on Aimovig, sending to [place] here for evaluation of Botox injections for migraines, general examination was normal including nose ears eyes throat, lungs, heart, musculoskeletal, psychiatric and neurologic, no details on patient's  migraine disorder provided which would be helpful when referring to a specialist.  I was able to review epic notes, patient was last seen at [facility] for Botox injections just a few days ago, unclear why she is being sent here for Botox if she is already seen at a headache clinic in the area.  It appears she is already tried Flexeril, tizanidine, Topamax, Trokendi, gabapentin, Lyrica, amitriptyline, Celexa, Lexapro, fluoxetine, Paxil, Zoloft, Cymbalta, Savella, doxepin, Aimovig, Botox, dry needling, yoga, acupuncture, trigger point injections and Botox.  She has also been seen in neurosurgery earlier this month for chronic neck pain.  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  Echo notes going back to at least 2008 for her  migraines.  Patient is here alone, as above she is already a patient at a headache clinic and received botox and is on CGRP. She does PT for her neck at [facility]. They are working on her posture, dry needling. She has such huge knots. She has been to pain management and continues to go, she has had steroid injections. She was in [place] and had a neurologist for 10 years, she has been here 3 years, she has seen [facility] neurology. She does not know what she wants to focus on today, she wakes up with migraines, she has difficulty falling asleep and she can't stay asleep, she wakes up with headaches 4-5 times a week. She loved Aimovig* but insurance wouldn't pay for both. She felt being on both  helped. She has TMJ. She treats her headaches with tylenol, tried [place]. She has at least one migraine that puts her in bed for 3-4 days severe but she also has moderately severe migraines al most every day. She has a headache every day. No aura. NO medication overuse. She does not know what she wants to focus on today, she wakes up with migraines, she has difficulty falling asleep and she can't stay asleep, she wakes up with headaches 4-5 times a week. She has new diplopia.   .  It appears she is already tried Flexeril, tizanidine, Topamax, Trokendi, gabapentin, Lyrica, amitriptyline, Celexa, Lexapro, fluoxetine, Paxil, Zoloft, Cymbalta, Savella, doxepin, Aimovig, Botox, dry needling, yoga, acupuncture, trigger point injections and Botox, Maxalt, Fioricet, propranolol, amlodipine     Review of Systems: Patient complains of symptoms per HPI as well as the following symptoms: insombia . Pertinent negatives and positives per HPI. All others negative    Social History   Socioeconomic History   Marital status: Married    Spouse name: Not on file   Number of children: 2   Years of education: Not on file   Highest education level: Not on file  Occupational History   Not on file  Tobacco Use   Smoking status: Never    Passive exposure: Never   Smokeless tobacco: Never  Vaping Use   Vaping Use: Never used  Substance and Sexual Activity   Alcohol use: Yes    Comment: social drinks rarely ("like a cocktail a month")   Drug use: Never   Sexual activity: Not on file  Other Topics Concern   Not on file  Social History Narrative   Lives at home with husband and dog   Right handed   Caffeine: n/a    Social Determinants of Health   Financial Resource Strain: Not on file  Food Insecurity: Not on file  Transportation Needs: Not on file  Physical Activity: Not on file  Stress: Not on file  Social Connections: Not on file  Intimate Partner Violence: Not on file    Family History   Problem Relation Age of Onset   Heart attack Mother        triple bypass at 47 years old     Past Medical History:  Diagnosis Date   Acute confusional migraine, refractory 07/21/2014   Allergic rhinitis 10/16/2001   Rhinitis Allergic  NOS Rhinitis Allergic  NOS  Formatting of this note might be different from the original. Overview:  Rhinitis Allergic  NOS   Allergies    Anxiety 05/07/2020   Chronic migraine without aura, with intractable migraine, so stated, with status migrainosus 03/07/2020   Cognitive complaints 05/14/2018   COVID-19 long hauler manifesting chronic concentration  deficit 06/04/2020   Dry eye syndrome    Encounter for screening for cardiovascular disorders 03/11/2009   Essential hypertension 10/08/2006   (Problem list name updated by automated process. Provider to review and confirm.) (Problem list name updated by automated process. Provider to review and confirm.)  Formatting of this note might be different from the original. Overview:  (Problem list name updated by automated process. Provider to review and confirm.)   Excessive daytime sleepiness 06/04/2020   Fibromyalgia    Gastritis    Gastroesophageal reflux disease without esophagitis 04/13/2018   GERD (gastroesophageal reflux disease)    Headache 04/28/2013   Problem list name updated by automated process. Provider to review Problem list name updated by automated process. Provider to review  Formatting of this note might be different from the original. Overview:  Problem list name updated by automated process. Provider to review   Hemorrhoids 10/13/2018   High blood pressure    History of colon polyps 10/13/2018   IBS (irritable bowel syndrome)    Ingrown toenail 01/20/2020   Intractable migraine 04/28/2013   Formatting of this note might be different from the original. Overview:  Problem list name updated by automated process. Provider to review  Formatting of this note might be different from the  original. Overview:  updating diagnosis code for icd10 cutover   Ischial bursitis of left side 07/01/2017   Added automatically from request for surgery [id number]   Low back pain 04/28/2016   Lumbar radiculopathy 04/28/2016   Migraine 10/16/2001   Problem list name updated by automated process. Provider to review Problem list name updated by automated process. Provider to review  updating diagnosis code for icd10 cutover updating diagnosis code for icd10 cutover  updating diagnosis code for icd10 cutover updating diagnosis code for icd10 cutover  Migraine Without Aura Migraine Without Aura  Formatting of this note might be different from th   Migraine without aura, not refractory 07/29/2013   Formatting of this note might be different from the original. Overview:  updating diagnosis code for icd10 cutover   Migraines    Mild major depression (HCC) 10/16/2009   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   Morning headache 08/02/2020   Raynaud's disease    Raynaud's phenomenon 10/08/2006   (Problem list name updated by automated process. Provider to review and confirm.) (Problem list name updated by automated process. Provider to review and confirm.)   Raynaud's syndrome without gangrene 10/08/2006   Formatting of this note might be different from the original. Overview:  (Problem list name updated by automated process. 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   Wolff-Parkinson-White syndrome     Patient Active Problem List   Diagnosis Date Noted   Dyslipidemia 12/27/2021   Gastritis 06/28/2021   Attention deficit hyperactivity disorder (ADHD), combined type 02/19/2021   Psychophysiological insomnia 02/19/2021   Abdominal bloating 12/10/2020   Microscopic colitis 12/10/2020   Migraines    IBS (irritable bowel syndrome)    GERD (  gastroesophageal reflux disease)    Dry eye syndrome    High blood pressure    Allergies    Morning headache 08/02/2020   Excessive daytime sleepiness 06/04/2020   COVID-19 long hauler manifesting chronic concentration deficit 06/04/2020   Severe obstructive sleep apnea-hypopnea syndrome 06/04/2020   Anxiety 05/07/2020   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   Rosacea 05/07/2020   Chronic migraine without aura, with intractable migraine, so stated, with status migrainosus 03/07/2020   Raynaud's disease    Ingrown toenail 01/20/2020   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   Hemorrhoids 10/13/2018   History of colon polyps 10/13/2018   Cognitive complaints 05/14/2018   Other insomnia 05/14/2018   Reactive airway disease 04/13/2018   Scleroderma (HCC) 04/13/2018   Fibromyalgia 04/13/2018   Gastroesophageal reflux disease without esophagitis 04/13/2018   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 10/28/2017   Renal cyst 08/18/2017   Spondylosis of lumbar region without myelopathy or radiculopathy 07/17/2017   Ischial bursitis of left side 07/01/2017   Sacroiliitis (HCC) 06/05/2017   Irritable bowel syndrome with both constipation and diarrhea 04/17/2017   Thrombocytopenia (HCC) 04/17/2017   Low back pain 04/28/2016   Lumbar radiculopathy 04/28/2016   Acute confusional migraine, refractory 07/21/2014   Migraine without aura, not refractory 07/29/2013   Headache 04/28/2013   Intractable migraine 04/28/2013   Spondylolisthesis at L5-S1 level 11/29/2012   Mild  major depression (HCC) 10/16/2009   Encounter for screening for cardiovascular disorders 03/11/2009   Essential hypertension 10/08/2006   Raynaud's phenomenon 10/08/2006   Raynaud's syndrome without gangrene 10/08/2006   Allergic rhinitis 10/16/2001   Migraine 10/16/2001    Past Surgical History:  Procedure Laterality Date   ABDOMINAL HYSTERECTOMY     BACK SURGERY     lumbar fusion   CESAREAN SECTION  1999 and 1998   NECK SURGERY     PARTIAL HYSTERECTOMY      Current Outpatient Medications  Medication Sig Dispense Refill   ALPRAZolam (XANAX) 1 MG tablet Take 0.5 mg by mouth as needed for anxiety.     buPROPion (WELLBUTRIN XL) 150 MG 24 hr tablet Take 150 mg by mouth daily.     escitalopram (LEXAPRO) 10 MG tablet Take 10 mg by mouth in the morning and at bedtime.     estradiol (ESTRACE) 1 MG tablet Take 0.5 mg by mouth daily.     losartan (COZAAR) 25 MG tablet Take 1 tablet (25 mg total) by mouth daily. 90 tablet 3   metroNIDAZOLE (METROCREAM) 0.75 % cream Apply 1 application topically 2 (two) times daily.     pregabalin (LYRICA) 150 MG capsule Take 150 mg by mouth 3 (three) times daily.     SAVELLA 50 MG TABS tablet Take 50 mg by mouth 2 (two) times daily.     topiramate (TOPAMAX) 100 MG tablet Take 1 tablet (100 mg total) by mouth 2 (two) times daily. 180 tablet 0   UBRELVY 100 MG TABS Take 100 mg by mouth every 2 (two) hours as needed (migraine). Take as close to onset of migraine as possible. Max 200 mg (2 tablets) daily. 16 tablet 11   No current facility-administered medications for this visit.    Allergies as of 06/17/2022 - Review Complete 06/17/2022  Allergen Reaction Noted   Codeine Nausea And Vomiting and Other (See Comments) 12/03/1997   Gluten meal Other (See Comments) 10/28/2017   Milk (cow) Other (See Comments) 03/05/2017   Other Rash 05/31/2021   Amlodipine besylate Other (See Comments) 04/19/2020  Milk-related compounds  04/13/2018   Wheat bran Diarrhea  05/09/2021    Vitals: BP (!) 133/94   Pulse 86   Ht 5' 3.5" (1.613 m)   Wt 140 lb 6.4 oz (63.7 kg)   BMI 24.48 kg/m  Last Weight:  Wt Readings from Last 1 Encounters:  06/17/22 140 lb 6.4 oz (63.7 kg)   Last Height:   Ht Readings from Last 1 Encounters:  06/17/22 5' 3.5" (1.613 m)    Exam: NAD, pleasant                  Speech:    Speech is normal; fluent and spontaneous with normal comprehension.  Cognition:    The patient is oriented to person, place, and time;     recent and remote memory intact;     language fluent;    Cranial Nerves:    The pupils are equal, round, and reactive to light.Trigeminal sensation is intact and the muscles of mastication are normal. The face is symmetric. The palate elevates in the midline. Hearing intact. Voice is normal. Shoulder shrug is normal. The tongue has normal motion without fasciculations.   Coordination:  No dysmetria  Motor Observation:    No asymmetry, no atrophy, and no involuntary movements noted. Tone:    Normal muscle tone.     Strength:    Right deltoid weakness, bilateral triceps weakness     Sensation: intact to LT  Brisk reflexes, clonus AJs   Assessment/Plan:   Jennifer Reed is a 57 y.o. female here as requested by Adrienne Mocha, PA for intractable migraines and neck pain. MRI cervical spine in the past showed C3-C4:  Minimal posteriorly directed disc osteophyte complex substantial canal stenosis and was not followed up, she has myelopathic signs on exam.  Chronic migraines; She did extremely well on botox in the past, >> 70% improvement migraines. Restart botox (Migraines happen on the right, frequently nocturnal.  For the last 6 months migraines >10 a month and > 15 total headache days a month. No aura. No medication overuse. Tried and failed multiple medications.  Grains are all over the head or unilateral can radiate to the neck, pulsating pounding throbbing, photophobia phonophobia, nausea, hurts to move,  darkroom helps, can last 24 to 72 hours and has had headaches last even longer than that, moderate to severe, affecting her life, ongoing for at least the last 6 months at this severity and frequency.)  MRI cervical spine asap: prior MRI C3-C4:  Minimal posteriorly directed disc osteophyte complex substantial canal stenosis and was not followed up, she has myelopathic signs on exam.  Orders Placed This Encounter  Procedures   MR CERVICAL SPINE W WO CONTRAST    I spent 75 minutes of face-to-face and non-face-to-face time with patient on the  1. Chronic migraine without aura without status migrainosus, not intractable   2. Ataxia   3. At high risk for falls   4. Cervical stenosis of spinal canal   5. Cervical myelopathy with cervical radiculopathy (HCC)   6. Arm weakness   7. Pain in both upper extremities   8. Chronic neck pain   9. Clonus   10. Weakness of both lower extremities   11. Upper motor neuron lesion (HCC)    diagnosis.  This included previsit chart review, lab review, study review, order entry, electronic health record documentation, patient education on the different diagnostic and therapeutic options, counseling and coordination of care, risks and benefits of management, compliance, or  risk factor reduction   Addendum 09/09/2021; this is a patient with pressured speech, tangential, very distractible, she has been complaining of inattention and memory issues, I sent her for evaluation for ADHD at [facility] and I received a note back from Dr. Elisabeth Most was stated "not in attention or ADHD issue.  I believe her issue was with memory and cognitively not processing information, short-term working memory.  Stimulus's have caused side effects and she will not be able to tolerate.  She worries but Wilhemena Durie will not work and risk of adding another serotonin medication.  Recommend neurocognitive testing a specific evaluation for memory" I do wonder if patient is bipolar  or has other psychiatric issue causing this hypomania I am not really sure, we will refer her to Dr. Clayborn Heron, she already has an appointment.  (Addendum 06/01/2021: Email from patient that she has to wait months to see an adhd specialist and has to wait to get medication that may help her if she has adhd. We are sorry about that but I am not sure patient has ADHD but I think she needs to be evaluated for it.  Even though we suspect possible concomitant adhd, there are a lot of other things that may be causing her symptoms.,she is on a lot of medications including Topamax, Lyrica, Ambien, doxepin, benzodiazepine and she needs help with her insomnia which can also cause memory/inattention issues. I don;t think it is just that easy as she needs ADHD medication, She needs a comprehensive evaluation of her current medications, formal eval for ADHD and help with insomnia which is why we sent her to a [facility] center.  She can call her insurance and get a list of doctors in the area that manage or may specialize in adhd and we would be happy to refer her elsewhere. She can also speak with her primary care doctor. We will continue to help her with her migraines.)  We had a very long talk today I think that her concerns are due to multifactorial issues including: polypharmacy including Topamax, Lyrica, Ambien, doxepin, benzodiazepine and multiple other medications.  I think she also has untreated ADHD.  Also discussed normal cognitive aging.  Given her insomnia I think that is more behavioral I think she needs a sleep therapist and we had a very long discussion about proper sleep etiquette.  I recommended that maybe we start by slowly titrating off Topamax, if she needs 25 mg pills happy to give it to her, she can go see "[facility] attention specialist" has ADHD not only needs medication but also compensatory skills and it appears this is a multidisciplinary center for ADHD, then when she  can get some help with insomnia we can start taking her off some of the medications she takes for sleeping.  I think she will feel much better.  In the meantime we will continue to treat her migraines and transition her here for Botox.  PRIOR ASSESSMENT AND PLAN:02/2020  - Patient has been to multiple neurologists, headache centers, spine specialists(neck pain) and currently in pain management. Has been under the care of multiple doctors for her migraines for over 10 years or much longer(was with a neurologist for 10 years in [place] and currently a patient at the [facility], saw neurology at [facility] as well see H&P). She has tried and failed multiple medications, is on Botox, and still states that she she has daily headache burden. - Today we discussed having an MRI of  the brain, sleep evaluation(morning headaches), continuing botox, trying Nurtec preventatively (do not take Nurtec with Ubrelvy) and gave her 6 months of Aimovig samples. - Morning headaches, wakes up with them, this is an indication for a sleep eval and sleep study. - I'm afraid choices are limited in this patient since she has already tried so much and we may want to discuss cognitive behavioral therapy, biofeedback and ask her to explore with a therapist any past traumatic or psychiatric history that may predispose her to intractable migraines. She was in no distress today and appeared very well. - I discussed medication overuse and advised her not to take acute management such as triptans, analgesics etc more than 10x a month - If she wants to switch her botox injections over to [facility], we can do that but I  - I had a frank talk with patient that given her chronicity despite multiple specialist care I am not sure we are the right center for her and an academic center may have more to offer her, she declines.  - She uses doxepin, ambien for insomnia, I recommend sleep therapy and will not treat for insomnia or refill doxepin,  feel therapy would be better for her health instead of more medication. Also on xanax, these are all sedating meds and may cause morbidity or mortality if taken together. - Continue Topiramate, botox, lyrica, prevention. Startr Nurtec preventative.a -Continue maxalt acutely, may take zofran for nausea or migraine - She is seeing a cardiologist, treating HTN may also help with migraines, encouraged her to do so.    Orders Placed This Encounter  Procedures   MR CERVICAL SPINE W WO CONTRAST    No orders of the defined types were placed in this encounter.    Cc: Wilfrid Lund, PA,  Wilfrid Lund, [place]  Naomie Dean, MD  [facility] [address] [place], [place] [address]  Phone [phone] Fax [phone]

## 2022-06-17 NOTE — Patient Instructions (Addendum)
Start Botox approval process with dr Jaynee Eagles MRI cervical spine tomorrow 3 Ajovy samples once monthly Stop Aimovig Roselyn Meier script - my scripts - [facility], [place] [address] [phone]    Fremanezumab Injection What is this medication? FREMANEZUMAB (fre ma NEZ ue mab) prevents migraines. It works by blocking a substance in the body that causes migraines. It is a monoclonal antibody. This medicine may be used for other purposes; ask your health care provider or pharmacist if you have questions. COMMON BRAND NAME(S): AJOVY What should I tell my care team before I take this medication? They need to know if you have any of these conditions: An unusual or allergic reaction to fremanezumab, other medications, foods, dyes, or preservatives Pregnant or trying to get pregnant Breast-feeding How should I use this medication? This medication is injected under the skin. You will be taught how to prepare and give it. Take it as directed on the prescription label. Keep taking it unless your care team tells you to stop. It is important that you put your used needles and syringes in a special sharps container. Do not put them in a trash can. If you do not have a sharps container, call your pharmacist or care team to get one. Talk to your care team about the use of this medication in children. Special care may be needed. Overdosage: If you think you have taken too much of this medicine contact a poison control center or emergency room at once. NOTE: This medicine is only for you. Do not share this medicine with others. What if I miss a dose? If you miss a dose, take it as soon as you can. If it is almost time for your next dose, take only that dose. Do not take double or extra doses. What may interact with this medication? Interactions are not expected. This list may not describe all possible interactions. Give your health care provider a list of all the medicines,  herbs, non-prescription drugs, or dietary supplements you use. Also tell them if you smoke, drink alcohol, or use illegal drugs. Some items may interact with your medicine. What should I watch for while using this medication? Tell your care team if your symptoms do not start to get better or if they get worse. What side effects may I notice from receiving this medication? Side effects that you should report to your care team as soon as possible: Allergic reactions or angioedema--skin rash, itching or hives, swelling of the face, eyes, lips, tongue, arms, or legs, trouble swallowing or breathing Side effects that usually do not require medical attention (report to your care team if they continue or are bothersome): Pain, redness, or irritation at injection site This list may not describe all possible side effects. Call your doctor for medical advice about side effects. You may report side effects to FDA at 1-800-FDA-1088. Where should I keep my medication? Keep out of the reach of children and pets. Store in a refrigerator or at room temperature between 20 and 25 degrees C (68 and 77 degrees F). Refrigeration (preferred): Store in the refrigerator. Do not freeze. Keep in the original container until you are ready to take it. Remove the dose from the carton about 30 minutes before it is time for you to use it. If the dose is not used, it may be stored in the original container at room temperature for 7 days. Get rid of any unused medication after the expiration date. Room  Temperature: This medication may be stored at room temperature for up to 7 days. Keep it in the original container. Protect from light until time of use. If it is stored at room temperature, get rid of any unused medication after 7 days or after it expires, whichever is first. To get rid of medications that are no longer needed or have expired: Take the medication to a medication take-back program. Check with your pharmacy or law  enforcement to find a location. If you cannot return the medication, ask your pharmacist or care team how to get rid of this medication safely. NOTE: This sheet is a summary. It may not cover all possible information. If you have questions about this medicine, talk to your doctor, pharmacist, or health care provider.  2023 Elsevier/Gold Standard (2017-01-27 00:00:00)

## 2022-06-18 ENCOUNTER — Ambulatory Visit: Payer: 59

## 2022-06-18 DIAGNOSIS — M4802 Spinal stenosis, cervical region: Secondary | ICD-10-CM | POA: Diagnosis not present

## 2022-06-18 DIAGNOSIS — Z9181 History of falling: Secondary | ICD-10-CM

## 2022-06-18 DIAGNOSIS — M542 Cervicalgia: Secondary | ICD-10-CM

## 2022-06-18 DIAGNOSIS — G1229 Other motor neuron disease: Secondary | ICD-10-CM

## 2022-06-18 DIAGNOSIS — M5412 Radiculopathy, cervical region: Secondary | ICD-10-CM

## 2022-06-18 DIAGNOSIS — M79602 Pain in left arm: Secondary | ICD-10-CM

## 2022-06-18 DIAGNOSIS — R270 Ataxia, unspecified: Secondary | ICD-10-CM | POA: Diagnosis not present

## 2022-06-18 DIAGNOSIS — G8929 Other chronic pain: Secondary | ICD-10-CM

## 2022-06-18 DIAGNOSIS — G959 Disease of spinal cord, unspecified: Secondary | ICD-10-CM

## 2022-06-18 DIAGNOSIS — M79601 Pain in right arm: Secondary | ICD-10-CM

## 2022-06-18 DIAGNOSIS — R258 Other abnormal involuntary movements: Secondary | ICD-10-CM

## 2022-06-18 DIAGNOSIS — R29898 Other symptoms and signs involving the musculoskeletal system: Secondary | ICD-10-CM

## 2022-06-18 MED ORDER — GADOBENATE DIMEGLUMINE 529 MG/ML IV SOLN
13.0000 mL | Freq: Once | INTRAVENOUS | Status: AC | PRN
  Administered 2022-06-18: 13 mL via INTRAVENOUS

## 2022-06-19 ENCOUNTER — Encounter: Payer: Self-pay | Admitting: Neurology

## 2022-06-23 ENCOUNTER — Other Ambulatory Visit: Payer: Self-pay | Admitting: Neurology

## 2022-06-23 DIAGNOSIS — G8929 Other chronic pain: Secondary | ICD-10-CM

## 2022-06-23 DIAGNOSIS — M5412 Radiculopathy, cervical region: Secondary | ICD-10-CM

## 2022-06-24 ENCOUNTER — Telehealth: Payer: Self-pay | Admitting: Neurology

## 2022-06-24 ENCOUNTER — Telehealth: Payer: Self-pay | Admitting: *Deleted

## 2022-06-24 NOTE — Telephone Encounter (Signed)
Referral sent to [facility], phone # [phone]. 

## 2022-06-24 NOTE — Telephone Encounter (Signed)
Chronic Migraine CPT 64615  Botox J0585 Units:200  G43.709 Chronic Migraine without aura, not intractable, without status migrainous  Patient needs to restart Botox. There was a note routed to PA team on 1/25. Please do auth asap. Thank you!

## 2022-06-27 ENCOUNTER — Other Ambulatory Visit (HOSPITAL_COMMUNITY): Payer: Self-pay

## 2022-06-27 NOTE — Telephone Encounter (Signed)
  Benefit Verification [id number] Submitted!

## 2022-06-27 NOTE — Telephone Encounter (Signed)
Pharmacy Patient Advocate Encounter   Received notification from Tracy that prior authorization for Botox 200UNIT solution is required/requested.    PA submitted on 06/27/2022 to (ins) [employer] via CoverMyMeds Key [id number] Status is pending

## 2022-06-30 ENCOUNTER — Other Ambulatory Visit (HOSPITAL_COMMUNITY): Payer: Self-pay

## 2022-06-30 MED ORDER — ONABOTULINUMTOXINA 200 UNITS IJ SOLR: INTRAMUSCULAR | 3 refills | Status: DC

## 2022-06-30 NOTE — Telephone Encounter (Signed)
[employer] needs consent before shipping Botox, sent MyChart msg.

## 2022-06-30 NOTE — Telephone Encounter (Signed)
She can go to NP thanks

## 2022-06-30 NOTE — Addendum Note (Signed)
Addended by: Gildardo Griffes on: 06/30/2022 08:27 AM   Modules accepted: Orders

## 2022-06-30 NOTE — Telephone Encounter (Signed)
Botox 200 unit prescription sent to [facility].

## 2022-06-30 NOTE — Telephone Encounter (Signed)
LVM asking pt to call back and schedule. If she calls back, please schedule Botox with NP at least 2 or so weeks out to ensure we get medication in.

## 2022-06-30 NOTE — Telephone Encounter (Signed)
Pt scheduled Botox appt on 07/17/22 at 9:45 am.

## 2022-06-30 NOTE — Telephone Encounter (Signed)
Pharmacy Patient Advocate Encounter  Prior Authorization for Botox 200UNIT solution has been approved.    PA# PA Case ID #: [id number] Effective dates: 06/27/2022 through 09/25/2022  Must use [facility] [phone] Phone [phone] Fax

## 2022-07-17 ENCOUNTER — Ambulatory Visit: Payer: 59 | Admitting: Adult Health

## 2022-07-17 ENCOUNTER — Encounter: Payer: Self-pay | Admitting: Adult Health

## 2022-07-17 DIAGNOSIS — G43709 Chronic migraine without aura, not intractable, without status migrainosus: Secondary | ICD-10-CM | POA: Diagnosis not present

## 2022-07-17 MED ORDER — ONABOTULINUMTOXINA 200 UNITS IJ SOLR
155.0000 [IU] | Freq: Once | INTRAMUSCULAR | Status: AC
  Administered 2022-07-17: 155 [IU] via INTRAMUSCULAR

## 2022-07-17 NOTE — Progress Notes (Signed)
Botox- 200 units x 1 vial Lot: [id number] Expiration: 10/2024 NDC: [id number]  Bacteriostatic 0.9% Sodium Chloride- 69m total Lot: [id number]: 08/05/22 NDC: [id number] Dx: [id number] S/P [employer] Witnessed by SBeckie SaltsCMA

## 2022-07-17 NOTE — Progress Notes (Addendum)
Addendum Dr Lucia Gaskins 09/04/2022: Addedndum 09/04/2022: gave patient some Ajovy samples to "bridge" her until we got botox up and running until we stated her on botox. Now she keeps emailing asking for Ajovy. I've tried to explain she hasn't had 3 botox yet and also most insurances won't pay for botox and ajovy and in fact in her chart she was denied for aimovig and botox at last neurologist. I will prescribe Ajovy, she asked me to try again, she states she has the required # of migraines and headaches and I explained that she has to have a 50% reduction in botox to keep botox. I'll prescribe ajovy and ask her to please use mychrt responsibly. She states below ajovy did not help and I mentioned that to her but she would still like it I will prescribe.    Update 07/17/2022 JM: Patient is being seen for Botox injection. Last injection 08/2021. Took a break from them but ready to restart. She has also done a couple injections of Ajovy (samples provided by Dr. Lucia Gaskins at prior visit) but has not seen much benefit.  She also remains on topiramate.  Continues to have >10 migraines per month but can fluctuate. Continues to have neck pain, working with dentist for TMJ which she believes has also contributed to migraines.  Tolerated procedure well today. Will return in 3 months for repeat injection.     Consent Form Botulism Toxin Injection For Chronic Migraine    Reviewed orally with patient, additionally signature is on file:  Botulism toxin has been approved by the Federal drug administration for treatment of chronic migraine. Botulism toxin does not cure chronic migraine and it may not be effective in some patients.  The administration of botulism toxin is accomplished by injecting a small amount of toxin into the muscles of the neck and head. Dosage must be titrated for each individual. Any benefits resulting from botulism toxin tend to wear off after 3 months with a repeat injection required if benefit is to  be maintained. Injections are usually done every 3-4 months with maximum effect peak achieved by about 2 or 3 weeks. Botulism toxin is expensive and you should be sure of what costs you will incur resulting from the injection.  The side effects of botulism toxin use for chronic migraine may include:   -Transient, and usually mild, facial weakness with facial injections  -Transient, and usually mild, head or neck weakness with head/neck injections  -Reduction or loss of forehead facial animation due to forehead muscle weakness  -Eyelid drooping  -Dry eye  -Pain at the site of injection or bruising at the site of injection  -Double vision  -Potential unknown long term risks   Contraindications: You should not have Botox if you are pregnant, nursing, allergic to albumin, have an infection, skin condition, or muscle weakness at the site of the injection, or have myasthenia gravis, Lambert-Eaton syndrome, or ALS.  It is also possible that as with any injection, there may be an allergic reaction or no effect from the medication. Reduced effectiveness after repeated injections is sometimes seen and rarely infection at the injection site may occur. All care will be taken to prevent these side effects. If therapy is given over a long time, atrophy and wasting in the muscle injected may occur. Occasionally the patient's become refractory to treatment because they develop antibodies to the toxin. In this event, therapy needs to be modified.  I have read the above information and consent to the  administration of botulism toxin.    BOTOX PROCEDURE NOTE FOR MIGRAINE HEADACHE  Contraindications and precautions discussed with patient(above). Aseptic procedure was observed and patient tolerated procedure. Procedure performed by Ihor Austin, AGNP-BC.   The condition has existed for more than 6 months, and pt does not have a diagnosis of ALS, Myasthenia Gravis or Lambert-Eaton Syndrome.  Risks and benefits  of injections discussed and pt agrees to proceed with the procedure.  Written consent obtained  These injections are medically necessary. Pt  receives good benefits from these injections. These injections do not cause sedations or hallucinations which the oral therapies may cause.   Description of procedure:  The patient was placed in a sitting position. The standard protocol was used for Botox as follows, with 5 units of Botox injected at each site:  -Procerus muscle, midline injection  -Corrugator muscle, bilateral injection  -Frontalis muscle, bilateral injection, with 2 sites each side, medial injection was performed in the upper one third of the frontalis muscle, in the region vertical from the medial inferior edge of the superior orbital rim. The lateral injection was again in the upper one third of the forehead vertically above the lateral limbus of the cornea, 1.5 cm lateral to the medial injection site.  -Temporalis muscle injection, 4 sites, bilaterally. The first injection was 3 cm above the tragus of the ear, second injection site was 1.5 cm to 3 cm up from the first injection site in line with the tragus of the ear. The third injection site was 1.5-3 cm forward between the first 2 injection sites. The fourth injection site was 1.5 cm posterior to the second injection site. 5th site laterally in the temporalis  muscleat the level of the outer canthus.  -Occipitalis muscle injection, 3 sites, bilaterally. The first injection was done one half way between the occipital protuberance and the tip of the mastoid process behind the ear. The second injection site was done lateral and superior to the first, 1 fingerbreadth from the first injection. The third injection site was 1 fingerbreadth superiorly and medially from the first injection site.  -Cervical paraspinal muscle injection, 2 sites, bilaterally. The first injection site was 1 cm from the midline of the cervical spine, 3 cm inferior  to the lower border of the occipital protuberance. The second injection site was 1.5 cm superiorly and laterally to the first injection site.  -Trapezius muscle injection was performed at 3 sites, bilaterally. The first injection site was in the upper trapezius muscle halfway between the inflection point of the neck, and the acromion. The second injection site was one half way between the acromion and the first injection site. The third injection was done between the first injection site and the inflection point of the neck.    A total of 200 units of Botox was prepared, 155 units of Botox was injected as documented above, any Botox not injected was wasted. The patient tolerated the procedure well, there were no complications of the above procedure.   Ihor Austin, AGNP-BC  [facility] [address] [place], [place] [address]  Phone [phone] Fax [phone] Note: This document was prepared with digital dictation and possible smart phrase technology. Any transcriptional errors that result from this process are unintentional.

## 2022-07-29 ENCOUNTER — Ambulatory Visit: Payer: 59 | Admitting: Neurology

## 2022-07-30 ENCOUNTER — Encounter (INDEPENDENT_AMBULATORY_CARE_PROVIDER_SITE_OTHER): Payer: 59 | Admitting: Neurology

## 2022-07-30 DIAGNOSIS — G43709 Chronic migraine without aura, not intractable, without status migrainosus: Secondary | ICD-10-CM | POA: Diagnosis not present

## 2022-07-31 ENCOUNTER — Encounter: Payer: Self-pay | Admitting: Neurology

## 2022-07-31 NOTE — Telephone Encounter (Signed)
From last note:  06/17/2022: She has been to sleep clinic on Monday. She was diagnosed with possible adhd. She went to [place] [facility]. She did not like Hydrologist. Next time ifneeded [place] Tailored brain health we can try if needed. She had CTS right hand corrected.  waking up with headaches (sleep study completed in past and seeing another sleep specialist upcoming). Migraines happen on the right, frequently nocturnal.  For the last 6 months migraines >10 a month and > 15 total headache days a month. No aura. No medication overuse. Tried and failed multiple medications.  Grains are all over the head or unilateral can radiate to the neck, pulsating pounding throbbing, photophobia phonophobia, nausea, hurts to move, darkroom helps, can last 24 to 72 hours and has had headaches last even longer than that, moderate to severe, affecting her life, ongoing for at least the last 6 months at this severity and frequency.No other focal neurologic deficits, associated symptoms, inciting events or modifiable factors.

## 2022-08-04 NOTE — Telephone Encounter (Signed)
  Thank you for your message seeking medical advice.* My assessment and recommendation are as follows: Hi Jennifer Reed, I reviewed all our appointments. I remember we sent you to [facility] and we also send you for sleep study and formal neurocognitive testing. If you think you have ADHD (and I do believe [facility] tried you on several medications) I do think psychiatry would be the best place to go to be treated. I do not treat ADHD, it is a multifactorial treatment with meds, cognitive therapy and other modalities best served vt psychiatry. You are best to your primary care about getting you to psychiatry to discuss adhd thanks dr Hosteen Kienast  Sincerely,  Melvenia Beam, MD    *This exchange required the expertise of a doctor, nurse practitioner, physician assistant, optometrist or certified nurse midwife and qualifies as a Medical Advice Message, please visit [email or web address] for more details. [place] will bill your insurance on your behalf; copays and deductibles may apply. Questions? Reply to this message.

## 2022-08-25 ENCOUNTER — Encounter (INDEPENDENT_AMBULATORY_CARE_PROVIDER_SITE_OTHER): Payer: 59 | Admitting: Neurology

## 2022-08-25 DIAGNOSIS — G43709 Chronic migraine without aura, not intractable, without status migrainosus: Secondary | ICD-10-CM

## 2022-08-26 NOTE — Telephone Encounter (Signed)
  Thank you for your message seeking medical advice.* My assessment and recommendation are as follows: Hi Jennifer Reed, I am happy to prescribe Ajovy. But you are still getting botox. Some insurances will not allow both. We can try to prescribe Ajovy and everything may be ok but your insurance may not let you have the botox. You are scheduled for botox in June. I have no idea of knowing what your insurance will do. I reviewed your chart and it appears that at one time insurance wouldn;t pay for both aimovig and botox and this would be the same for ajovy and botox possibly. Also, to qualify for Ajovy you stil have to have > 8 migraine days a month and > 15 total headache(migraines + headaches) days a month, is that acurate? Let us know how you would like to proceed  Sincerely,  Jennifer Gaskins Griselda Miner, MD    *This exchange required the expertise of a doctor, nurse practitioner, physician assistant, optometrist or certified nurse midwife and qualifies as a Medical Advice Message, please visit [email or web address] for more details. Jennifer Reed will bill your insurance on your behalf; copays and deductibles may apply. Questions? Reply to this message.

## 2022-08-29 NOTE — Telephone Encounter (Signed)
  Thank you for your message seeking medical advice.* My assessment and recommendation are as follows: Jennifer Reed, I looked at your notes, when you last saw Jennifer Reed for botox you said Jennifer Reed was not helping. We can prescribe it but need to know: do you have > 8 migraine days a month and > 15 total headache (migraines + headaches) days a month which is the requirement for Jennifer Reed?. If not we can set up with one of my nurses to discuss other options (I'm booked for months so you' dhave to see an NP they are well trained and work with me closely) let me know thanks dr. Lucia Reed  Sincerely,  Jennifer Fret, MD    *This exchange required the expertise of a doctor, nurse practitioner, physician assistant, optometrist or certified nurse midwife and qualifies as a Medical Advice Message, please visit [email or web address] for more details. Jennifer Reed will bill your insurance on your behalf; copays and deductibles may apply. Questions? Reply to this message.

## 2022-09-04 ENCOUNTER — Other Ambulatory Visit: Payer: Self-pay | Admitting: Neurology

## 2022-09-04 DIAGNOSIS — G43711 Chronic migraine without aura, intractable, with status migrainosus: Secondary | ICD-10-CM

## 2022-09-04 MED ORDER — AJOVY 225 MG/1.5ML ~~LOC~~ SOAJ: 225.0000 mg | SUBCUTANEOUS | 11 refills | Status: DC

## 2022-09-09 ENCOUNTER — Telehealth: Payer: Self-pay | Admitting: Neurology

## 2022-09-09 NOTE — Telephone Encounter (Signed)
Annice Pih called from [employer] Stated Botox 200 will be delivered 5/2 with signature required.

## 2022-09-10 ENCOUNTER — Other Ambulatory Visit: Payer: Self-pay | Admitting: Cardiology

## 2022-09-15 NOTE — Telephone Encounter (Signed)
Started renewal for next Botox auth as current auth expires 09/25/22. Auth was denied, see letter under "authorization status" in patient documents. Reference Number: [id number]

## 2022-09-17 ENCOUNTER — Other Ambulatory Visit (HOSPITAL_COMMUNITY): Payer: Self-pay

## 2022-09-17 ENCOUNTER — Telehealth: Payer: Self-pay

## 2022-09-17 NOTE — Telephone Encounter (Signed)
Pharmacy Patient Advocate Encounter   Received notification from [facility] that prior authorization for Ajovy is required/requested.   PA submitted on 09/17/2022 to (ins) [employer] via CoverMyMeds Key or (Medicaid) confirmation # [id number] Status is pending

## 2022-09-17 NOTE — Telephone Encounter (Signed)
A new telephone call encounter has been made for this PA Request-please see telephone note dated 09/17/2022. 

## 2022-09-17 NOTE — Telephone Encounter (Signed)
Will do appeal (pt has only had one injection of botox on 07-17-2022) so cannot state that it has helped).

## 2022-09-18 ENCOUNTER — Telehealth: Payer: Self-pay

## 2022-09-18 ENCOUNTER — Other Ambulatory Visit (HOSPITAL_COMMUNITY): Payer: Self-pay

## 2022-09-18 ENCOUNTER — Encounter: Payer: Self-pay | Admitting: *Deleted

## 2022-09-18 NOTE — Telephone Encounter (Signed)
Pharmacy Patient Advocate Encounter  Prior Authorization for AJOVY (fremanezumab-vfrm) injection 225MG /1.5ML auto-injectors has been approved by [employer] (ins).    PA # PA Case ID: [id number] Effective dates: 09/17/2022 through 03/20/2023

## 2022-09-18 NOTE — Telephone Encounter (Signed)
Pharmacy Patient Advocate Encounter   Received notification from [facility] that prior authorization for Ubrelvy 100MG  tablets is required/requested.   PA submitted on 09/18/2022 to (ins) [employer] via CoverMyMeds Key or (Medicaid) confirmation # [id number] Status is pending

## 2022-09-22 NOTE — Telephone Encounter (Signed)
Dr Lucia Gaskins received this update in epic:

## 2022-09-22 NOTE — Telephone Encounter (Signed)
BOTOX APPEAL Letter done.  To be signed then faxed.

## 2022-09-26 ENCOUNTER — Encounter: Payer: Self-pay | Admitting: Cardiology

## 2022-09-27 ENCOUNTER — Other Ambulatory Visit (HOSPITAL_COMMUNITY): Payer: Self-pay

## 2022-09-30 ENCOUNTER — Telehealth: Payer: Self-pay

## 2022-09-30 ENCOUNTER — Other Ambulatory Visit (HOSPITAL_COMMUNITY): Payer: Self-pay

## 2022-09-30 NOTE — Telephone Encounter (Signed)
Pt is scheduled for 10/22/2022 with Megan.

## 2022-09-30 NOTE — Telephone Encounter (Signed)
Has [employer] given you any update on this appeal?

## 2022-09-30 NOTE — Telephone Encounter (Signed)
Patient Advocate Encounter   Received notification from [employer] that prior authorization is required for Botox 200UNIT solution   Submitted: 09-30-2022 Key [id number]  Status is pending

## 2022-09-30 NOTE — Telephone Encounter (Signed)
Received fax from [employer] BOTOX 200units approved.   MEMBEr ID [id number], CASE # [id number] Valid 09-11-2022 thru 12-24-2022.

## 2022-09-30 NOTE — Telephone Encounter (Signed)
I called the APPEALS Dept.  Spoke to Santina Evans,  Decision was overturned 09-24-2022 for BOTOX.  Faxed to [phone].  I asked to fax letter to my [phone].

## 2022-10-01 NOTE — Telephone Encounter (Signed)
PA approved for botox 09/11/2022-12/24/2022 Case #- [id number] Approved through [employer]

## 2022-10-16 NOTE — Telephone Encounter (Signed)
Please see the MyChart message reply(ies) for my assessment and plan.    This patient gave consent for this Medical Advice Message and is aware that it may result in a bill to [employer], as well as the possibility of receiving a bill for a co-payment or deductible. They are an established patient, but are not seeking medical advice exclusively about a problem treated during an in person or video visit in the last seven days. I did not recommend an in person or video visit within seven days of my reply.    I spent a total of 30 minutes cumulative time within 7 days through [employer].  Anson Fret, MD

## 2022-10-22 ENCOUNTER — Ambulatory Visit: Payer: 59 | Admitting: Adult Health

## 2022-10-28 ENCOUNTER — Ambulatory Visit (INDEPENDENT_AMBULATORY_CARE_PROVIDER_SITE_OTHER): Payer: 59 | Admitting: Neurology

## 2022-10-28 ENCOUNTER — Telehealth: Payer: Self-pay | Admitting: Neurology

## 2022-10-28 DIAGNOSIS — G43711 Chronic migraine without aura, intractable, with status migrainosus: Secondary | ICD-10-CM | POA: Diagnosis not present

## 2022-10-28 MED ORDER — ONABOTULINUMTOXINA 200 UNITS IJ SOLR
155.0000 [IU] | Freq: Once | INTRAMUSCULAR | Status: AC
  Administered 2022-10-28: 155 [IU] via INTRAMUSCULAR

## 2022-10-28 MED ORDER — KETOROLAC TROMETHAMINE 60 MG/2ML IM SOLN
60.0000 mg | Freq: Once | INTRAMUSCULAR | Status: AC
  Administered 2022-10-28: 60 mg via INTRAMUSCULAR

## 2022-10-28 MED ORDER — ZAVEGEPANT HCL 10 MG/ACT NA SOLN: 1.0000 | Freq: Once | NASAL | 0 refills | Status: AC

## 2022-10-28 NOTE — Progress Notes (Unsigned)
10/28/2022: stable  Addendum Dr Lucia Gaskins 09/04/2022: Addedndum 09/04/2022: gave patient some Ajovy samples to "bridge" her until we got botox up and running until we stated her on botox. Now she keeps emailing asking for Ajovy. I've tried to explain she hasn't had 3 botox yet and also most insurances won't pay for botox and ajovy and in fact in her chart she was denied for aimovig and botox at last neurologist. I will prescribe Ajovy, she asked me to try again, she states she has the required # of migraines and headaches and I explained that she has to have a 50% reduction in botox to keep botox. I'll prescribe ajovy and ask her to please use mychrt responsibly. She states below ajovy did not help and I mentioned that to her but she would still like it I will prescribe.    Update 07/17/2022 JM: Patient is being seen for Botox injection. Last injection 08/2021. Took a break from them but ready to restart. She has also done a couple injections of Ajovy (samples provided by Dr. Lucia Gaskins at prior visit) but has not seen much benefit.  She also remains on topiramate.  Continues to have >10 migraines per month but can fluctuate. Continues to have neck pain, working with dentist for TMJ which she believes has also contributed to migraines.  Tolerated procedure well today. Will return in 3 months for repeat injection.     Consent Form Botulism Toxin Injection For Chronic Migraine    Reviewed orally with patient, additionally signature is on file:  Botulism toxin has been approved by the Federal drug administration for treatment of chronic migraine. Botulism toxin does not cure chronic migraine and it may not be effective in some patients.  The administration of botulism toxin is accomplished by injecting a small amount of toxin into the muscles of the neck and head. Dosage must be titrated for each individual. Any benefits resulting from botulism toxin tend to wear off after 3 months with a repeat injection  required if benefit is to be maintained. Injections are usually done every 3-4 months with maximum effect peak achieved by about 2 or 3 weeks. Botulism toxin is expensive and you should be sure of what costs you will incur resulting from the injection.  The side effects of botulism toxin use for chronic migraine may include:   -Transient, and usually mild, facial weakness with facial injections  -Transient, and usually mild, head or neck weakness with head/neck injections  -Reduction or loss of forehead facial animation due to forehead muscle weakness  -Eyelid drooping  -Dry eye  -Pain at the site of injection or bruising at the site of injection  -Double vision  -Potential unknown long term risks   Contraindications: You should not have Botox if you are pregnant, nursing, allergic to albumin, have an infection, skin condition, or muscle weakness at the site of the injection, or have myasthenia gravis, Lambert-Eaton syndrome, or ALS.  It is also possible that as with any injection, there may be an allergic reaction or no effect from the medication. Reduced effectiveness after repeated injections is sometimes seen and rarely infection at the injection site may occur. All care will be taken to prevent these side effects. If therapy is given over a long time, atrophy and wasting in the muscle injected may occur. Occasionally the patient's become refractory to treatment because they develop antibodies to the toxin. In this event, therapy needs to be modified.  I have read the above information  and consent to the administration of botulism toxin.    BOTOX PROCEDURE NOTE FOR MIGRAINE HEADACHE  Contraindications and precautions discussed with patient(above). Aseptic procedure was observed and patient tolerated procedure. Procedure performed by Ihor Austin, AGNP-BC.   The condition has existed for more than 6 months, and pt does not have a diagnosis of ALS, Myasthenia Gravis or Lambert-Eaton  Syndrome.  Risks and benefits of injections discussed and pt agrees to proceed with the procedure.  Written consent obtained  These injections are medically necessary. Pt  receives good benefits from these injections. These injections do not cause sedations or hallucinations which the oral therapies may cause.   Description of procedure:  The patient was placed in a sitting position. The standard protocol was used for Botox as follows, with 5 units of Botox injected at each site:  -Procerus muscle, midline injection  -Corrugator muscle, bilateral injection  -Frontalis muscle, bilateral injection, with 2 sites each side, medial injection was performed in the upper one third of the frontalis muscle, in the region vertical from the medial inferior edge of the superior orbital rim. The lateral injection was again in the upper one third of the forehead vertically above the lateral limbus of the cornea, 1.5 cm lateral to the medial injection site.  -Temporalis muscle injection, 4 sites, bilaterally. The first injection was 3 cm above the tragus of the ear, second injection site was 1.5 cm to 3 cm up from the first injection site in line with the tragus of the ear. The third injection site was 1.5-3 cm forward between the first 2 injection sites. The fourth injection site was 1.5 cm posterior to the second injection site. 5th site laterally in the temporalis  muscleat the level of the outer canthus.  -Occipitalis muscle injection, 3 sites, bilaterally. The first injection was done one half way between the occipital protuberance and the tip of the mastoid process behind the ear. The second injection site was done lateral and superior to the first, 1 fingerbreadth from the first injection. The third injection site was 1 fingerbreadth superiorly and medially from the first injection site.  -Cervical paraspinal muscle injection, 2 sites, bilaterally. The first injection site was 1 cm from the midline of the  cervical spine, 3 cm inferior to the lower border of the occipital protuberance. The second injection site was 1.5 cm superiorly and laterally to the first injection site.  -Trapezius muscle injection was performed at 3 sites, bilaterally. The first injection site was in the upper trapezius muscle halfway between the inflection point of the neck, and the acromion. The second injection site was one half way between the acromion and the first injection site. The third injection was done between the first injection site and the inflection point of the neck.    A total of 200 units of Botox was prepared, 155 units of Botox was injected as documented above, any Botox not injected was wasted. The patient tolerated the procedure well, there were no complications of the above procedure.   Ihor Austin, AGNP-BC  [facility] [address] [place], [place] [address]  Phone [phone] Fax [phone] Note: This document was prepared with digital dictation and possible smart phrase technology. Any transcriptional errors that result from this process are unintentional.

## 2022-10-28 NOTE — Progress Notes (Unsigned)
Toradol 60mg / 2 ml given R upper outer quadrant per verbal order Dr. Lucia Gaskins.  Pt tolerated well. Bandaid applied.

## 2022-10-28 NOTE — Progress Notes (Unsigned)
Botox- 200 units x 1 vial Lot: [id number] Expiration: 02/2025 NDC: 4540-9811-91  Bacteriostatic 0.9% Sodium Chloride- 4  mL  Lot: [id number] Expiration: AO1308 NDC: 0409-1966-2025  Dx: G43.709  S/P  Witnessed by Delmer Islam

## 2022-10-30 NOTE — Telephone Encounter (Signed)
error 

## 2022-11-10 ENCOUNTER — Ambulatory Visit (HOSPITAL_BASED_OUTPATIENT_CLINIC_OR_DEPARTMENT_OTHER)
Admission: RE | Admit: 2022-11-10 | Discharge: 2022-11-10 | Disposition: A | Discharge status: Home / Self Care | Payer: Self-pay | Source: Ambulatory Visit | Attending: Cardiology | Admitting: Cardiology

## 2022-11-10 ENCOUNTER — Ambulatory Visit: Payer: 59 | Attending: Cardiology | Admitting: Cardiology

## 2022-11-10 VITALS — BP 130/78 | HR 88 | Ht 63.5 in | Wt 145.0 lb

## 2022-11-10 DIAGNOSIS — I7300 Raynaud's syndrome without gangrene: Secondary | ICD-10-CM | POA: Diagnosis not present

## 2022-11-10 DIAGNOSIS — Z8249 Family history of ischemic heart disease and other diseases of the circulatory system: Secondary | ICD-10-CM

## 2022-11-10 DIAGNOSIS — G4733 Obstructive sleep apnea (adult) (pediatric): Secondary | ICD-10-CM | POA: Diagnosis not present

## 2022-11-10 DIAGNOSIS — I10 Essential (primary) hypertension: Secondary | ICD-10-CM | POA: Diagnosis not present

## 2022-11-10 DIAGNOSIS — E785 Hyperlipidemia, unspecified: Secondary | ICD-10-CM

## 2022-11-10 NOTE — Addendum Note (Signed)
Addended by: Baldo Ash D on: 11/10/2022 08:33 AM   Modules accepted: Orders

## 2022-11-10 NOTE — Patient Instructions (Addendum)
Medication Instructions:  Your physician recommends that you continue on your current medications as directed. Please refer to the Current Medication list given to you today.  *If you need a refill on your cardiac medications before your next appointment, please call your pharmacy*   Lab Work: 3rd Floor  [address] Lipid, AST, ALT-today If you have labs (blood work) drawn today and your tests are completely normal, you will receive your results only by: MyChart Message (if you have MyChart) OR A paper copy in the mail If you have any lab test that is abnormal or we need to change your treatment, we will call you to review the results.   Testing/Procedures: We will order CT coronary calcium score. It will cost $99.00 and is not covered by insurance.  Please call to schedule.    [facility] [address] [place], [place] [address] [phone]     Follow-Up: At [facility], you and your health needs are our priority.  As part of our continuing mission to provide you with exceptional heart care, we have created designated Provider Care Teams.  These Care Teams include your primary Cardiologist (physician) and Advanced Practice Providers (APPs -  Physician Assistants and Nurse Practitioners) who all work together to provide you with the care you need, when you need it.  We recommend signing up for the patient portal called "MyChart".  Sign up information is provided on this After Visit Summary.  MyChart is used to connect with patients for Virtual Visits (Telemedicine).  Patients are able to view lab/test results, encounter notes, upcoming appointments, etc.  Non-urgent messages can be sent to your provider as well.   To learn more about what you can do with MyChart, go to [email or web address].    Your next appointment:   12 month(s)  The format for your next appointment:   In Person  Provider:   Gypsy Balsam, MD    Other Instructions NA

## 2022-11-10 NOTE — Progress Notes (Signed)
Cardiology Office Note:    Date:  11/10/2022   ID:  Jennifer Reed, DOB 02-21-1966, MRN [id number]  PCP:  Wilfrid Lund, PA  Cardiologist:  Gypsy Balsam, MD    Referring MD: Wilfrid Lund, [place]   Chief Complaint  Patient presents with   Follow-up  Doing fine but do have heartburn  History of Present Illness:    Jennifer Reed is a 57 y.o. female past medical history significant for essential hypertension, anxiety, dyslipidemia, renal phenomenon comes today to months for follow-up overall cardiac wise doing well.  She can walk climb stairs no problem she does have 15 steps at home she can climb 2 she gets short of breath but no chest pain tightness squeezing pressure burning chest.  She described to have heartburn especially after certain type of food it happened especially at night and when she lays down she takes proton pump inhibitor with relief.  She did have GI specialist and years ago she had to have esophageal stretching done.  I told [name] to see GI specialist to clarify that.  Past Medical History:  Diagnosis Date   Acute confusional migraine, refractory 07/21/2014   Allergic rhinitis 10/16/2001   Rhinitis Allergic  NOS Rhinitis Allergic  NOS  Formatting of this note might be different from the original. Overview:  Rhinitis Allergic  NOS   Allergies    Anxiety 05/07/2020   Chronic migraine without aura, with intractable migraine, so stated, with status migrainosus 03/07/2020   Cognitive complaints 05/14/2018   COVID-19 long hauler manifesting chronic concentration deficit 06/04/2020   Dry eye syndrome    Encounter for screening for cardiovascular disorders 03/11/2009   Essential hypertension 10/08/2006   (Problem list name updated by automated process. Provider to review and confirm.) (Problem list name updated by automated process. Provider to review and confirm.)  Formatting of this note might be different from the original. Overview:  (Problem list name updated by  automated process. Provider to review and confirm.)   Excessive daytime sleepiness 06/04/2020   Fibromyalgia    Gastritis    Gastroesophageal reflux disease without esophagitis 04/13/2018   GERD (gastroesophageal reflux disease)    Headache 04/28/2013   Problem list name updated by automated process. Provider to review Problem list name updated by automated process. Provider to review  Formatting of this note might be different from the original. Overview:  Problem list name updated by automated process. Provider to review   Hemorrhoids 10/13/2018   High blood pressure    History of colon polyps 10/13/2018   IBS (irritable bowel syndrome)    Ingrown toenail 01/20/2020   Intractable migraine 04/28/2013   Formatting of this note might be different from the original. Overview:  Problem list name updated by automated process. Provider to review  Formatting of this note might be different from the original. Overview:  updating diagnosis code for icd10 cutover   Ischial bursitis of left side 07/01/2017   Added automatically from request for surgery [id number]   Low back pain 04/28/2016   Lumbar radiculopathy 04/28/2016   Migraine 10/16/2001   Problem list name updated by automated process. Provider to review Problem list name updated by automated process. Provider to review  updating diagnosis code for icd10 cutover updating diagnosis code for icd10 cutover  updating diagnosis code for icd10 cutover updating diagnosis code for icd10 cutover  Migraine Without Aura Migraine Without Aura  Formatting of this note might be different from th   Migraine without aura, not refractory  07/29/2013   Formatting of this note might be different from the original. 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 Provider to review and confirm.) (Problem list name updated by automated process. Provider to review and confirm.)   Raynaud's syndrome without gangrene 10/08/2006   Formatting of this note might be different from the original. Overview:  (Problem list name updated by automated process. 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   botulinum toxin Type A (BOTOX) 200 units injection Provider to inject 155 units into the muscles of the head and neck every 12 weeks. Discard remainder. (Patient taking differently: Inject 200 Units into the muscle every 3 (three) months. Provider to inject 155 units into the muscles of the head and neck every 12 weeks. Discard remainder.)   buPROPion (WELLBUTRIN XL) 150 MG 24 hr tablet Take 150 mg by mouth daily.   escitalopram (LEXAPRO) 10 MG tablet Take 10 mg by mouth in the morning and at bedtime.   estradiol (ESTRACE) 1 MG tablet Take 0.5 mg by mouth daily.   losartan (COZAAR) 25 MG tablet TAKE 1 TABLET (25 MG  TOTAL) BY MOUTH DAILY.   metroNIDAZOLE (METROCREAM) 0.75 % cream Apply 1 application topically 2 (two) times daily.   pregabalin (LYRICA) 150 MG capsule Take 150 mg by mouth 3 (three) times daily.   SAVELLA 50 MG TABS tablet Take 50 mg by mouth 2 (two) times daily.   topiramate (TOPAMAX) 100 MG tablet Take 1 tablet (100 mg total) by mouth 2 (two) times daily.   UBRELVY 100 MG TABS Take 100 mg by mouth every 2 (two) hours as needed (migraine). Take as close to onset of migraine as possible. Max 200 mg (2 tablets) daily.     Allergies:   Codeine, Gluten meal, Milk (cow), Other, Amlodipine besylate, Milk-related compounds, and Wheat   Social History   Socioeconomic History   Marital status: Married    Spouse name: Not on file   Number of children: 2   Years of education: Not on file   Highest education level: Not on file  Occupational History   Not on file  Tobacco Use   Smoking status: Never    Passive exposure: Never   Smokeless tobacco: Never  Vaping Use   Vaping Use: Never used  Substance and Sexual Activity   Alcohol use: Yes    Comment: social drinks rarely ("like a cocktail a month")   Drug use: Never   Sexual activity: Not on file  Other Topics Concern   Not on file  Social History Narrative   Lives at home with husband and dog   Right handed   Caffeine: n/a    Social Determinants of Health   Financial Resource Strain: Not on file  Food Insecurity: Not on file  Transportation Needs: Not on file  Physical Activity: Not on file  Stress: Not on file  Social Connections: Not on file     Family History: The patient's family history includes Heart attack in her mother. ROS:   Please see the history of present illness.    All 14 point review of systems negative except as described per history of present illness  EKGs/Labs/Other Studies Reviewed:    EKG Interpretation Date/Time:  Monday November 10 2022 08:12:22 EDT Ventricular Rate:  91 PR Interval:  106 QRS  Duration:  76 QT Interval:  368 QTC Calculation: 452 R Axis:   -11  Text Interpretation: Sinus rhythm with short PR No previous ECGs available Confirmed by Gypsy Balsam [id number]) on 11/10/2022 8:15:05 AM    Recent Labs: No results found for requested labs within last 365 days.  Recent Lipid Panel    Component Value Date/Time   CHOL 228 (H) 12/20/2021 1131   TRIG 57 12/20/2021 1131   HDL 88 12/20/2021 1131   CHOLHDL 2.6 12/20/2021 1131   LDLCALC 130 (H) 12/20/2021 1131    Physical Exam:    VS:  BP 130/78 (BP Location: Left Arm, Patient Position: Sitting)   Pulse 88   Ht 5' 3.5" (1.613 m)   Wt 145 lb (65.8 kg)   SpO2 97%   BMI 25.28 kg/m     Wt Readings from Last 3 Encounters:  11/10/22 145 lb (65.8 kg)  06/17/22 140 lb 6.4 oz (63.7 kg)  12/27/21 132 lb (59.9 kg)     GEN:  Well nourished, well developed in no acute distress HEENT: Normal NECK: No JVD; No carotid bruits LYMPHATICS: No lymphadenopathy CARDIAC: RRR, no murmurs, no rubs, no gallops RESPIRATORY:  Clear to auscultation without rales, wheezing or rhonchi  ABDOMEN: Soft, non-tender, non-distended MUSCULOSKELETAL:  No edema; No deformity  SKIN: Warm and dry LOWER EXTREMITIES: no swelling NEUROLOGIC:  Alert and oriented x 3 PSYCHIATRIC:  Normal affect   ASSESSMENT:    1. Essential hypertension   2. Raynaud's disease without gangrene   3. Severe obstructive sleep apnea-hypopnea syndrome   4. Hyperlipidemia LDL goal <130    PLAN:    In order of problems listed above:  Essential hypertension: Blood pressure seems to well-controlled continue present management. Raynaud's phenomenon denies have any issues right now. Dyslipidemia I will ask her to have fasting lipid profile done.  Last time we calculated her 10 years.  Is at risk which was only 0.9%, which is very low.  However I suggested to do calcium score especially since she tells me about her family history of premature coronary artery disease and  the symptoms that she complained today about which is very atypical suggesting GI issue. GERD: Strongly recommended to follow-up with GI.   Medication Adjustments/Labs and Tests Ordered: Current medicines are reviewed at length with the patient today.  Concerns regarding medicines are outlined above.  Orders Placed This Encounter  Procedures   EKG 12-Lead   Medication changes: No  orders of the defined types were placed in this encounter.   Signed, Georgeanna Lea, MD, [facility] 11/10/2022 8:15 AM    [facility]

## 2022-11-11 ENCOUNTER — Encounter: Payer: Self-pay | Admitting: Cardiology

## 2022-11-11 ENCOUNTER — Other Ambulatory Visit (HOSPITAL_COMMUNITY): Payer: Self-pay

## 2022-11-11 ENCOUNTER — Telehealth: Payer: Self-pay

## 2022-11-11 DIAGNOSIS — E785 Hyperlipidemia, unspecified: Secondary | ICD-10-CM

## 2022-11-11 LAB — AST: AST: 16 IU/L (ref 0–40)

## 2022-11-11 LAB — LIPID PANEL
Chol/HDL Ratio: 3.1 ratio (ref 0.0–4.4)
Cholesterol, Total: 247 mg/dL — ABNORMAL HIGH (ref 100–199)
HDL: 80 mg/dL (ref >39)
LDL Chol Calc (NIH): 139 mg/dL — ABNORMAL HIGH (ref 0–99)
Triglycerides: 158 mg/dL — ABNORMAL HIGH (ref 0–149)
VLDL Cholesterol Cal: 28 mg/dL (ref 5–40)

## 2022-11-11 LAB — ALT: ALT: 11 IU/L (ref 0–32)

## 2022-11-11 MED ORDER — ATORVASTATIN CALCIUM 10 MG PO TABS: 10.0000 mg | ORAL_TABLET | Freq: Every day | ORAL | 3 refills | Status: DC

## 2022-11-11 NOTE — Telephone Encounter (Signed)
Results reviewed with pt as per Dr. Vanetta Shawl note. Sent Lipitor to pharmacy. Labs ordered for 6 weeks. Note sent to pt by My Chart.  Pt verbalized understanding and had no additional questions. Routed to PCP

## 2022-11-12 ENCOUNTER — Encounter: Payer: Self-pay | Admitting: Cardiology

## 2022-11-14 ENCOUNTER — Other Ambulatory Visit (HOSPITAL_BASED_OUTPATIENT_CLINIC_OR_DEPARTMENT_OTHER): Payer: 59

## 2022-12-01 ENCOUNTER — Other Ambulatory Visit (HOSPITAL_COMMUNITY): Payer: Self-pay

## 2022-12-05 ENCOUNTER — Telehealth: Payer: Self-pay

## 2022-12-05 ENCOUNTER — Other Ambulatory Visit (HOSPITAL_COMMUNITY): Payer: Self-pay

## 2022-12-05 NOTE — Telephone Encounter (Signed)
Pharmacy Patient Advocate Encounter  Received notification from [facility] that Prior Authorization for Ubrelvy 100MG  tablets has been APPROVED from 12/05/2022 to 12/05/2023. Ran test claim, Copay is $Unable to obtain copay due to Refill too soon rejection-Next available fill date is 12/15/2022 .  PA #/Case ID/Reference #: PA Case ID #: [id number]

## 2022-12-05 NOTE — Telephone Encounter (Signed)
Pharmacy Patient Advocate Encounter   Received notification from Fax that prior authorization for Ubrelvy 100MG  tablets is required/requested.   Insurance verification completed.   The patient is insured through [facility] .   Per test claim: PA required; PA submitted to [facility] via CoverMyMeds Key/confirmation #/EOC [id number] Status is pending

## 2022-12-09 ENCOUNTER — Encounter: Payer: Self-pay | Admitting: Neurology

## 2022-12-09 ENCOUNTER — Telehealth: Payer: Self-pay

## 2022-12-09 ENCOUNTER — Other Ambulatory Visit (HOSPITAL_COMMUNITY): Payer: Self-pay

## 2022-12-09 DIAGNOSIS — G43711 Chronic migraine without aura, intractable, with status migrainosus: Secondary | ICD-10-CM

## 2022-12-09 MED ORDER — ZAVZPRET 10 MG/ACT NA SOLN: 1.0000 | NASAL | 0 refills | Status: DC | PRN

## 2022-12-09 NOTE — Addendum Note (Signed)
Addended by: Bertram Savin on: 12/09/2022 03:35 PM   Modules accepted: Orders

## 2022-12-09 NOTE — Telephone Encounter (Signed)
Pharmacy Patient Advocate Encounter   Received notification from CoverMyMeds that prior authorization for Zavzpret 10MG /ACT solution is required/requested.   Insurance verification completed.   The patient is insured through [facility] .   Per test claim: PA required; PA submitted to [facility] via CoverMyMeds Key/confirmation #/EOC [id number] Status is pending

## 2022-12-09 NOTE — Telephone Encounter (Signed)
Received a PA request via CMM for Zavzpret 10MG /ACT solution, Could not tell from chart notes if this is still needed-did not see on active med list in Epic-Please advise.

## 2022-12-15 NOTE — Telephone Encounter (Signed)
Insurance will not cover if Patient is taking another CGRP with Zavzpret. She is also taking Ubrelvy.

## 2022-12-15 NOTE — Telephone Encounter (Signed)
Pharmacy Patient Advocate Encounter  Received notification from [facility] that Prior Authorization for Zavzpret 10MG /ACT solution has been DENIED. Please advise how you'd like to proceed. Full denial letter will be uploaded to the media tab. See denial reason below.   PA #/Case ID/Reference #: PA Case ID #: [id number]

## 2023-01-06 LAB — LIPID PANEL
LDL Chol Calc (NIH): 95 mg/dL (ref 0–99)
Triglycerides: 119 mg/dL (ref 0–149)

## 2023-01-06 LAB — AST: AST: 19 IU/L (ref 0–40)

## 2023-01-07 NOTE — Progress Notes (Signed)
Cardiology Clinic Note   Patient Name: Jennifer Reed Date of Encounter: 01/09/2023  Primary Care Provider:  Wilfrid Lund, PA Primary Cardiologist:  Gypsy Balsam, MD  Patient Profile    57 year old female with history for essential hypertension, dyslipidemia, anxiety, OSA, and Raynaud's phenomenon.  Last seen in the office by Dr. Bing Matter on 11/10/2022.  He recommended a calcium score given that she has a family history of premature coronary artery disease.  Coronary calcium score completed on 11/10/2022 was elevated at 1194.  She was started on Lipitor 10 mg daily and is to have a follow-up lipid and LFT evaluation 6 weeks later.  Repeat study completed on 01/05/2023 revealed total cholesterol 178 (down from 247), HDL 62 (down from 80), LDL 95 (down from 139) 1 month previous.  Past Medical History    Past Medical History:  Diagnosis Date   Acute confusional migraine, refractory 07/21/2014   Allergic rhinitis 10/16/2001   Rhinitis Allergic  NOS Rhinitis Allergic  NOS  Formatting of this note might be different from the original. Overview:  Rhinitis Allergic  NOS   Allergies    Anxiety 05/07/2020   Chronic migraine without aura, with intractable migraine, so stated, with status migrainosus 03/07/2020   Cognitive complaints 05/14/2018   COVID-19 long hauler manifesting chronic concentration deficit 06/04/2020   Dry eye syndrome    Encounter for screening for cardiovascular disorders 03/11/2009   Essential hypertension 10/08/2006   (Problem list name updated by automated process. Provider to review and confirm.) (Problem list name updated by automated process. Provider to review and confirm.)  Formatting of this note might be different from the original. Overview:  (Problem list name updated by automated process. Provider to review and confirm.)   Excessive daytime sleepiness 06/04/2020   Fibromyalgia    Gastritis    Gastroesophageal reflux disease without esophagitis 04/13/2018    GERD (gastroesophageal reflux disease)    Headache 04/28/2013   Problem list name updated by automated process. Provider to review Problem list name updated by automated process. Provider to review  Formatting of this note might be different from the original. Overview:  Problem list name updated by automated process. Provider to review   Hemorrhoids 10/13/2018   High blood pressure    History of colon polyps 10/13/2018   IBS (irritable bowel syndrome)    Ingrown toenail 01/20/2020   Intractable migraine 04/28/2013   Formatting of this note might be different from the original. Overview:  Problem list name updated by automated process. Provider to review  Formatting of this note might be different from the original. Overview:  updating diagnosis code for icd10 cutover   Ischial bursitis of left side 07/01/2017   Added automatically from request for surgery [id number]   Low back pain 04/28/2016   Lumbar radiculopathy 04/28/2016   Migraine 10/16/2001   Problem list name updated by automated process. Provider to review Problem list name updated by automated process. Provider to review  updating diagnosis code for icd10 cutover updating diagnosis code for icd10 cutover  updating diagnosis code for icd10 cutover updating diagnosis code for icd10 cutover  Migraine Without Aura Migraine Without Aura  Formatting of this note might be different from th   Migraine without aura, not refractory 07/29/2013   Formatting of this note might be different from the original. 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 Provider to review and confirm.) (Problem list name updated by automated process. Provider to review and confirm.)   Raynaud's syndrome without  gangrene 10/08/2006   Formatting of this note might be different from the original. Overview:  (Problem list name updated by automated process. 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 Vent comes today for ongoing assessment and management of hypertension and hypercholesterolemia.  She is normally followed by Dr. Bing Matter in [place].  She has had recent labs as discussed above.  She did have a coronary calcium score that was elevated greater than 1000 and therefore Lipitor was started.  Just prior to coming into this visit she did receive a message from Dr. Vanetta Shawl office where he would prefer that her Lipitor be increased to 20 mg daily from 10 mg daily.  She comes today anxious.  [place] the [place] when her best friend had a  subdural hematoma leading to stroke.  She was there with her friend during that episode and accompanying her to the hospital and stayed with her during her recovery.  She is now very worried about the friend, and continues to relive the event with frequent anxiety attacks associated.   Home Medications    Current Outpatient Medications  Medication Sig Dispense Refill   ALPRAZolam (XANAX) 1 MG tablet Take 0.5 mg by mouth as needed for anxiety.     botulinum toxin Type A (BOTOX) 200 units injection Provider to inject 155 units into the muscles of the head and neck every 12 weeks. Discard remainder. (Patient taking differently: Inject 200 Units into the muscle every 3 (three) months. Provider to inject 155 units into the muscles of the head and neck every 12 weeks. Discard remainder.) 1 each 3   buPROPion (WELLBUTRIN XL) 150 MG 24 hr tablet Take 150 mg by mouth daily.     escitalopram (LEXAPRO) 10 MG tablet Take 10 mg by mouth in the morning and at bedtime.     estradiol (ESTRACE) 1 MG tablet Take 0.5 mg by mouth daily.     losartan (COZAAR) 25 MG tablet TAKE 1 TABLET (  25 MG TOTAL) BY MOUTH DAILY. 90 tablet 1   pregabalin (LYRICA) 150 MG capsule Take 150 mg by mouth 3 (three) times daily.     SAVELLA 50 MG TABS tablet Take 50 mg by mouth 2 (two) times daily.     topiramate (TOPAMAX) 100 MG tablet Take 1 tablet (100 mg total) by mouth 2 (two) times daily. 180 tablet 0   UBRELVY 100 MG TABS Take 100 mg by mouth every 2 (two) hours as needed (migraine). Take as close to onset of migraine as possible. Max 200 mg (2 tablets) daily. 16 tablet 11   Zavegepant HCl (ZAVZPRET) 10 MG/ACT SOLN Place 1 spray into the nose as needed (migraine). Administer as soon as possible at migraine onset. Max of 1 spray per day. 8 each 0   atorvastatin (LIPITOR) 20 MG tablet Take 1 tablet (20 mg total) by mouth daily. 90 tablet 3   metroNIDAZOLE (METROCREAM) 0.75 % cream Apply 1 application topically 2 (two) times daily.  (Patient not taking: Reported on 01/09/2023)     No current facility-administered medications for this visit.     Family History    Family History  Problem Relation Age of Onset   Heart attack Mother        triple bypass at 73 years old    She indicated that the status of her mother is unknown.  Social History    Social History   Socioeconomic History   Marital status: Married    Spouse name: Not on file   Number of children: 2   Years of education: Not on file   Highest education level: Not on file  Occupational History   Not on file  Tobacco Use   Smoking status: Never    Passive exposure: Never   Smokeless tobacco: Never  Vaping Use   Vaping status: Never Used  Substance and Sexual Activity   Alcohol use: Yes    Comment: social drinks rarely ("like a cocktail a month")   Drug use: Never   Sexual activity: Not on file  Other Topics Concern   Not on file  Social History Narrative   Lives at home with husband and dog   Right handed   Caffeine: n/a    Social Determinants of Health   Financial Resource Strain: Low Risk  (06/09/2022)   Received from [facility], [facility]   Overall Financial Resource Strain (CARDIA)    Difficulty of Paying Living Expenses: Not hard at all  Food Insecurity: No Food Insecurity (06/09/2022)   Received from [facility], [facility]   Hunger Vital Sign    Worried About Running Out of Food in the Last Year: Never true    Ran Out of Food in the Last Year: Never true  Transportation Needs: No Transportation Needs (06/09/2022)   Received from [facility], [facility]   PRAPARE - Transportation    Lack of Transportation (Medical): No    Lack of Transportation (Non-Medical): No  Physical Activity: Insufficiently Active (06/09/2022)   Received from [facility], [facility]   Exercise Vital Sign    Days of Exercise per Week: 3 days    Minutes of Exercise per Session: 10 min  Stress: No Stress Concern Present (06/09/2022)    Received from [facility], [facility] - Occupational Stress Questionnaire    Feeling of Stress : Not at all  Social Connections: Moderately Integrated (06/09/2022)   [facility] from [facility], [facility]   Social Network    How would you rate your social network (family, work, friends)?: Adequate participation with social networks  Intimate Partner Violence: Not At Risk (06/09/2022)   Received from [facility], [facility]   HITS    Over the last 12 months how often did your partner physically hurt you?: 1    Over the last 12 months how often did your partner insult you or talk down to you?: 1    Over the last 12 months how often did your partner threaten you with physical harm?: 1    Over the last 12 months how often did your partner scream or curse at you?: 1     Review of Systems    General:  No chills, fever, night sweats or weight changes.  Cardiovascular:  No chest pain, dyspnea on exertion, edema, orthopnea, palpitations, paroxysmal nocturnal dyspnea. Dermatological: No rash, lesions/masses Respiratory: No cough, dyspnea Urologic: No hematuria, dysuria Abdominal:   No nausea, vomiting, diarrhea, bright red blood per rectum, melena, or hematemesis Neurologic:  No visual changes, wkns, changes in mental status. All other systems reviewed and are otherwise negative except as noted above.       Physical Exam    VS:  BP 122/68 (BP Location: Left Arm, Patient Position: Sitting, Cuff Size: Normal)   Pulse 100   Ht 5\' 4"  (1.626 m)   Wt 146 lb 12.8 oz (66.6 kg)   SpO2 95%   BMI 25.20 kg/m  , BMI Body mass index is 25.2 kg/m.     GEN: Well nourished, well developed, in no acute distress. HEENT: normal. Neck: Supple, no JVD, carotid bruits, or masses. Cardiac: RRR, tachycardic, no murmurs, rubs, or gallops. No clubbing, cyanosis, edema.  Radials/DP/PT 2+ and equal bilaterally.  Respiratory:  Respirations regular and  unlabored, clear to auscultation bilaterally. GI: Soft, nontender, nondistended, BS + x 4. MS: no deformity or atrophy. Skin: warm and dry, no rash. Neuro:  Strength and sensation are intact. Psych: Normal affect.      Lab Results  Component Value Date   WBC 3.9 (L) 12/14/2020   HGB 17.4 (H) 12/14/2020   HCT 51.1 (H) 12/14/2020   MCV 93.1 12/14/2020   PLT 186 12/14/2020   Lab Results  Component Value Date   CREATININE 0.75 12/31/2020   BUN 13 12/31/2020   NA 139 12/31/2020   K 4.1 12/31/2020   CL 103 12/31/2020   CO2 21 12/31/2020   Lab Results  Component Value Date   ALT 14 01/05/2023   AST 19 01/05/2023   ALKPHOS 86 12/14/2020   BILITOT 0.5 12/14/2020   Lab Results  Component Value Date   CHOL 178 01/05/2023   HDL 62 01/05/2023   LDLCALC 95 01/05/2023   TRIG 119 01/05/2023   CHOLHDL 2.9 01/05/2023    No results found for: "HGBA1C"   Review of Prior Studies   Coronary calcium score 12-02-22 Narrative & Impression  CLINICAL DATA:  Cardiovascular Disease Risk stratification   EXAM: Coronary Calcium Score   TECHNIQUE: A gated, non-contrast computed tomography scan of the heart was performed using 3mm slice thickness. Axial images were analyzed on a dedicated workstation. Calcium scoring of the coronary arteries was performed using the Agatston method.   FINDINGS: Coronary arteries: Normal origins.   Coronary Calcium Score:   Left main: 0   Left anterior descending artery: 287   Left circumflex artery: 0   Right coronary artery: 907   Total: 1194  Percentile: 99   Pericardium: Normal.   Ascending Aorta: Normal caliber.   Non-cardiac: See separate report from [facility].   IMPRESSION: Coronary calcium score of 1194. This was 50 percentile for age-, race-, and sex-matched controls.       Assessment & Plan   1.  Hypercholesterolemia: She has had significant improvement in total cholesterol, and LDL with addition of Lipitor  10 mg daily by Dr. Bing Matter.  Today however he has messaged her based upon the lab results and his increase her Lipitor to 20 mg daily from 10 mg daily.  It is likely that he would like to keep the LDL closer to 70 given elevated coronary calcium score.  I will repeat lipids and LFTs in 6 weeks to monitor her response to medication.  She is to report any significant myalgias or drug intolerance with the higher dose.  2.  Elevated coronary calcium score: Most significant calcium was noted in the LAD and right coronary artery.  She is concerned about these high numbers.  She is completely asymptomatic concerning chest pain or dyspnea on exertion.  She remains very active.  She can be considered for a nuclear medicine stress test if she becomes symptomatic or at the discretion of Dr. Bing Matter to evaluate for ischemia.  3. Hypertension: Excellent control of blood pressure today.  Continue losartan 25 mg daily.  Follow-up labs are pending with lipid studies.  4.  Situational anxiety: She has had a traumatic event with her best friend having a stroke and caring for her during that event accompanying her to the hospital.  She is on antianxiety medications.  I have advised that she may need to seek psychological counseling to be able to help to process this traumatic event.  I have also given her a mindfulness exercise which may be helpful to her during the times when she is most anxious.  She should follow-up with her PCP for further recommendations and referrals.         Signed, Bettey Mare. Liborio Nixon, ANP, AACC   01/09/2023 2:03 PM      Office [phone] Fax [phone]  Notice: This dictation was prepared with Dragon dictation along with smaller phrase technology. Any transcriptional errors that result from this process are unintentional and may not be corrected upon review.

## 2023-01-09 ENCOUNTER — Ambulatory Visit: Payer: 59 | Attending: Adult Health | Admitting: Adult Health

## 2023-01-09 ENCOUNTER — Encounter: Payer: Self-pay | Admitting: Adult Health

## 2023-01-09 ENCOUNTER — Telehealth: Payer: Self-pay

## 2023-01-09 VITALS — BP 122/68 | HR 100 | Ht 64.0 in | Wt 146.8 lb

## 2023-01-09 DIAGNOSIS — E785 Hyperlipidemia, unspecified: Secondary | ICD-10-CM

## 2023-01-09 DIAGNOSIS — I7300 Raynaud's syndrome without gangrene: Secondary | ICD-10-CM | POA: Diagnosis not present

## 2023-01-09 DIAGNOSIS — E7800 Pure hypercholesterolemia, unspecified: Secondary | ICD-10-CM

## 2023-01-09 DIAGNOSIS — I10 Essential (primary) hypertension: Secondary | ICD-10-CM | POA: Diagnosis not present

## 2023-01-09 MED ORDER — ATORVASTATIN CALCIUM 20 MG PO TABS: 20.0000 mg | ORAL_TABLET | Freq: Every day | ORAL | 3 refills | Status: DC

## 2023-01-09 NOTE — Telephone Encounter (Signed)
Left message on My Chart with Cholesterol results per Dr. Vanetta Shawl note. Routed to PCP.

## 2023-01-09 NOTE — Patient Instructions (Signed)
Medication Instructions:  No changes *If you need a refill on your cardiac medications before your next appointment, please call your pharmacy*   Lab Work: CMET, Lipid Panel. In 6 weeks If you have labs (blood work) drawn today and your tests are completely normal, you will receive your results only by: MyChart Message (if you have MyChart) OR A paper copy in the mail If you have any lab test that is abnormal or we need to change your treatment, we will call you to review the results.   Testing/Procedures: No Testing   Follow-Up: At [facility], you and your health needs are our priority.  As part of our continuing mission to provide you with exceptional heart care, we have created designated Provider Care Teams.  These Care Teams include your primary Cardiologist (physician) and Advanced Practice Providers (APPs -  Physician Assistants and Nurse Practitioners) who all work together to provide you with the care you need, when you need it.  We recommend signing up for the patient portal called "MyChart".  Sign up information is provided on this After Visit Summary.  MyChart is used to connect with patients for Virtual Visits (Telemedicine).  Patients are able to view lab/test results, encounter notes, upcoming appointments, etc.  Non-urgent messages can be sent to your provider as well.   To learn more about what you can do with MyChart, go to [email or web address].    Your next appointment:   3 month(s)  Provider:   Gypsy Balsam, MD

## 2023-01-09 NOTE — Telephone Encounter (Signed)
Pt viewed results in My Chart per Dr. Krasowski's note. Routed to PCP.  

## 2023-01-14 NOTE — Telephone Encounter (Signed)
PA renewal denied via [employer]. I've attached denial letter, let me know what you'd like to do.

## 2023-01-14 NOTE — Telephone Encounter (Signed)
Submitted appeal request via CMM with note that pt is being monitored for medication overuse headache. Key: [id number]

## 2023-01-15 ENCOUNTER — Encounter: Payer: Self-pay | Admitting: Cardiology

## 2023-01-15 ENCOUNTER — Other Ambulatory Visit: Payer: Self-pay

## 2023-01-15 MED ORDER — LOSARTAN POTASSIUM 25 MG PO TABS: 25.0000 mg | ORAL_TABLET | Freq: Every day | ORAL | 1 refills | Status: DC

## 2023-01-19 ENCOUNTER — Encounter: Payer: Self-pay | Admitting: Neurology

## 2023-01-19 NOTE — Telephone Encounter (Signed)
I see you submitted an appeal rq, have you received an update on appeal yet?

## 2023-01-21 NOTE — Telephone Encounter (Signed)
Received fax of approval. Case #: [id number] (01/06/23-04/21/23)   I called [employer] and scheduled delivery for tomorrow, 9/12.

## 2023-01-23 ENCOUNTER — Ambulatory Visit: Payer: 59 | Admitting: Neurology

## 2023-01-26 ENCOUNTER — Ambulatory Visit (INDEPENDENT_AMBULATORY_CARE_PROVIDER_SITE_OTHER): Payer: 59 | Admitting: Neurology

## 2023-01-26 ENCOUNTER — Encounter: Payer: Self-pay | Admitting: Cardiology

## 2023-01-26 DIAGNOSIS — G43709 Chronic migraine without aura, not intractable, without status migrainosus: Secondary | ICD-10-CM

## 2023-01-26 MED ORDER — SAVELLA 50 MG PO TABS: 50.0000 mg | ORAL_TABLET | Freq: Two times a day (BID) | ORAL | 0 refills | Status: AC

## 2023-01-26 MED ORDER — ONABOTULINUMTOXINA 200 UNITS IJ SOLR
155.0000 [IU] | Freq: Once | INTRAMUSCULAR | Status: AC
Start: 2023-01-26 — End: 2023-01-26
  Administered 2023-01-26: 155 [IU] via INTRAMUSCULAR

## 2023-01-26 NOTE — Progress Notes (Signed)
9/16/202: stable  10/28/2022: stable  Addendum Dr Lucia Gaskins 09/04/2022: Addedndum 09/04/2022: gave patient some Ajovy samples to "bridge" her until we got botox up and running until we stated her on botox. Now she keeps emailing asking for Ajovy. I've tried to explain she hasn't had 3 botox yet and also most insurances won't pay for botox and ajovy and in fact in her chart she was denied for aimovig and botox at last neurologist. I will prescribe Ajovy, she asked me to try again, she states she has the required # of migraines and headaches and I explained that she has to have a 50% reduction in botox to keep botox. I'll prescribe ajovy and ask her to please use mychrt responsibly. She states below ajovy did not help and I mentioned that to her but she would still like it I will prescribe.    Update 07/17/2022 JM: Patient is being seen for Botox injection. Last injection 08/2021. Took a break from them but ready to restart. She has also done a couple injections of Ajovy (samples provided by Dr. Lucia Gaskins at prior visit) but has not seen much benefit.  She also remains on topiramate.  Continues to have >10 migraines per month but can fluctuate. Continues to have neck pain, working with dentist for TMJ which she believes has also contributed to migraines.  Tolerated procedure well today. Will return in 3 months for repeat injection.     Consent Form Botulism Toxin Injection For Chronic Migraine    Reviewed orally with patient, additionally signature is on file:  Botulism toxin has been approved by the Federal drug administration for treatment of chronic migraine. Botulism toxin does not cure chronic migraine and it may not be effective in some patients.  The administration of botulism toxin is accomplished by injecting a small amount of toxin into the muscles of the neck and head. Dosage must be titrated for each individual. Any benefits resulting from botulism toxin tend to wear off after 3 months with a  repeat injection required if benefit is to be maintained. Injections are usually done every 3-4 months with maximum effect peak achieved by about 2 or 3 weeks. Botulism toxin is expensive and you should be sure of what costs you will incur resulting from the injection.  The side effects of botulism toxin use for chronic migraine may include:   -Transient, and usually mild, facial weakness with facial injections  -Transient, and usually mild, head or neck weakness with head/neck injections  -Reduction or loss of forehead facial animation due to forehead muscle weakness  -Eyelid drooping  -Dry eye  -Pain at the site of injection or bruising at the site of injection  -Double vision  -Potential unknown long term risks   Contraindications: You should not have Botox if you are pregnant, nursing, allergic to albumin, have an infection, skin condition, or muscle weakness at the site of the injection, or have myasthenia gravis, Lambert-Eaton syndrome, or ALS.  It is also possible that as with any injection, there may be an allergic reaction or no effect from the medication. Reduced effectiveness after repeated injections is sometimes seen and rarely infection at the injection site may occur. All care will be taken to prevent these side effects. If therapy is given over a long time, atrophy and wasting in the muscle injected may occur. Occasionally the patient's become refractory to treatment because they develop antibodies to the toxin. In this event, therapy needs to be modified.  I have read the  above information and consent to the administration of botulism toxin.    BOTOX PROCEDURE NOTE FOR MIGRAINE HEADACHE  Contraindications and precautions discussed with patient(above). Aseptic procedure was observed and patient tolerated procedure. Procedure performed by Ihor Austin, AGNP-BC.   The condition has existed for more than 6 months, and pt does not have a diagnosis of ALS, Myasthenia Gravis or  Lambert-Eaton Syndrome.  Risks and benefits of injections discussed and pt agrees to proceed with the procedure.  Written consent obtained  These injections are medically necessary. Pt  receives good benefits from these injections. These injections do not cause sedations or hallucinations which the oral therapies may cause.   Description of procedure:  The patient was placed in a sitting position. The standard protocol was used for Botox as follows, with 5 units of Botox injected at each site:  -Procerus muscle, midline injection  -Corrugator muscle, bilateral injection  -Frontalis muscle, bilateral injection, with 2 sites each side, medial injection was performed in the upper one third of the frontalis muscle, in the region vertical from the medial inferior edge of the superior orbital rim. The lateral injection was again in the upper one third of the forehead vertically above the lateral limbus of the cornea, 1.5 cm lateral to the medial injection site.  -Temporalis muscle injection, 4 sites, bilaterally. The first injection was 3 cm above the tragus of the ear, second injection site was 1.5 cm to 3 cm up from the first injection site in line with the tragus of the ear. The third injection site was 1.5-3 cm forward between the first 2 injection sites. The fourth injection site was 1.5 cm posterior to the second injection site. 5th site laterally in the temporalis  muscleat the level of the outer canthus.  -Occipitalis muscle injection, 3 sites, bilaterally. The first injection was done one half way between the occipital protuberance and the tip of the mastoid process behind the ear. The second injection site was done lateral and superior to the first, 1 fingerbreadth from the first injection. The third injection site was 1 fingerbreadth superiorly and medially from the first injection site.  -Cervical paraspinal muscle injection, 2 sites, bilaterally. The first injection site was 1 cm from the  midline of the cervical spine, 3 cm inferior to the lower border of the occipital protuberance. The second injection site was 1.5 cm superiorly and laterally to the first injection site.  -Trapezius muscle injection was performed at 3 sites, bilaterally. The first injection site was in the upper trapezius muscle halfway between the inflection point of the neck, and the acromion. The second injection site was one half way between the acromion and the first injection site. The third injection was done between the first injection site and the inflection point of the neck.    A total of 200 units of Botox was prepared, 155 units of Botox was injected as documented above, any Botox not injected was wasted. The patient tolerated the procedure well, there were no complications of the above procedure.   Ihor Austin, AGNP-BC  [facility] [address] [place], [place] [address]  Phone [phone] Fax [phone] Note: This document was prepared with digital dictation and possible smart phrase technology. Any transcriptional errors that result from this process are unintentional.

## 2023-01-26 NOTE — Progress Notes (Signed)
Botox- 200 units x 1 vial Lot: [id number] Expiration: 05/2025 NDC: 1308-6578-46  Bacteriostatic 0.9% Sodium Chloride- 4 mL  Lot: [id number] Expiration: 08/11/2023 NDC: 8413-2440-10  Dx: U72.536 S/P  Witnessed Elana Alm

## 2023-02-17 ENCOUNTER — Telehealth: Payer: Self-pay | Admitting: Pharmacy Technician

## 2023-02-17 ENCOUNTER — Other Ambulatory Visit (HOSPITAL_COMMUNITY): Payer: Self-pay

## 2023-02-17 NOTE — Telephone Encounter (Signed)
Pharmacy Patient Advocate Encounter   Received notification from CoverMyMeds that prior authorization for Zavzpret 10MG /ACT solution is required/requested.   Insurance verification completed.   The patient is insured through [facility] .   Per test claim: PA required; PA submitted to [facility] via CoverMyMeds Key/confirmation #/EOC [id number] Status is pending

## 2023-02-20 NOTE — Telephone Encounter (Signed)
Pharmacy Patient Advocate Encounter  Received notification from [facility] that Prior Authorization for Zavzpret 10MG /ACT solution has been APPROVED from 02/18/2023 to 05/20/2023   PA #/Case ID/Reference #: PA Case ID #: [id number]

## 2023-02-25 LAB — COMPREHENSIVE METABOLIC PANEL
ALT: 12 [IU]/L (ref 0–32)
AST: 18 [IU]/L (ref 0–40)
Albumin: 4.2 g/dL (ref 3.8–4.9)
Alkaline Phosphatase: 102 [IU]/L (ref 44–121)
BUN/Creatinine Ratio: 13 (ref 9–23)
BUN: 12 mg/dL (ref 6–24)
Bilirubin Total: <0.2 mg/dL (ref 0.0–1.2)
CO2: 23 mmol/L (ref 20–29)
Calcium: 9.2 mg/dL (ref 8.7–10.2)
Chloride: 106 mmol/L (ref 96–106)
Creatinine, Ser: 0.91 mg/dL (ref 0.57–1.00)
Globulin, Total: 2.2 g/dL (ref 1.5–4.5)
Glucose: 63 mg/dL — ABNORMAL LOW (ref 70–99)
Potassium: 4.3 mmol/L (ref 3.5–5.2)
Sodium: 142 mmol/L (ref 134–144)
Total Protein: 6.4 g/dL (ref 6.0–8.5)
eGFR: 75 mL/min/{1.73_m2} (ref >59)

## 2023-02-25 LAB — LIPID PANEL
Chol/HDL Ratio: 2.5 {ratio} (ref 0.0–4.4)
Cholesterol, Total: 162 mg/dL (ref 100–199)
HDL: 64 mg/dL (ref >39)
LDL Chol Calc (NIH): 78 mg/dL (ref 0–99)
Triglycerides: 110 mg/dL (ref 0–149)
VLDL Cholesterol Cal: 20 mg/dL (ref 5–40)

## 2023-02-26 ENCOUNTER — Telehealth: Payer: Self-pay

## 2023-02-26 NOTE — Telephone Encounter (Addendum)
Called patient regarding results. Left detailed message for patient regarding results. Letter mailed to patient on 02/26/23.----- Message from Joni Reining sent at 02/26/2023  7:12 AM EDT ----- I have reviewed the labs all are normal Glucose was low because she had not eaten, and is of no concern. Cholesterol is well controlled.

## 2023-03-26 ENCOUNTER — Telehealth: Payer: Self-pay | Admitting: Neurology

## 2023-03-26 NOTE — Telephone Encounter (Signed)
Submitted Botox PA renewal via CMM, status is pending. Key: [id number]

## 2023-03-30 NOTE — Telephone Encounter (Signed)
PA was denied, Jennifer Reed is saying they need to see that Botox is working as well as that pt is monitored for medication overuse headache. I submitted e-appeal, status is pending.

## 2023-04-04 ENCOUNTER — Encounter: Payer: Self-pay | Admitting: Neurology

## 2023-04-06 NOTE — Telephone Encounter (Signed)
Received approval from [place], auth# [id number] (03/26/23-06/30/23).

## 2023-04-08 ENCOUNTER — Other Ambulatory Visit: Payer: Self-pay | Admitting: Neurology

## 2023-04-08 DIAGNOSIS — G43001 Migraine without aura, not intractable, with status migrainosus: Secondary | ICD-10-CM

## 2023-04-13 NOTE — Telephone Encounter (Signed)
Arelia from [employer] called and LVM wanting to set up delivery for the pt's BOTOX  [phone]

## 2023-04-13 NOTE — Telephone Encounter (Signed)
Returned [employer]'s call, Botox TBD 12/4.

## 2023-04-16 ENCOUNTER — Ambulatory Visit: Payer: 59 | Admitting: Cardiology

## 2023-04-23 ENCOUNTER — Ambulatory Visit: Payer: 59 | Admitting: Neurology

## 2023-04-23 DIAGNOSIS — G43709 Chronic migraine without aura, not intractable, without status migrainosus: Secondary | ICD-10-CM

## 2023-04-23 MED ORDER — ONABOTULINUMTOXINA 200 UNITS IJ SOLR
155.0000 [IU] | Freq: Once | INTRAMUSCULAR | Status: AC
Start: 2023-04-23 — End: 2023-04-23
  Administered 2023-04-23: 155 [IU] via INTRAMUSCULAR

## 2023-04-23 NOTE — Progress Notes (Signed)
Botox- 200 units x 1 vial Lot: [id number] Expiration: 07/2025 NDC: 6295-2841-32  Bacteriostatic 0.9% Sodium Chloride- 4 mL  Lot: [id number] Expiration: 08/11/2022 NDC: 7253-6644-03  Dx: K74.259 S/P Witnessed by Leeann Must RN

## 2023-04-23 NOTE — Progress Notes (Signed)
04/23/2023: Doing well > 50% improvement in migraine freq and severity. Patient feels that her migraines cause her jaw to ache in her jaw aching also can cause her migraines to worsen it is a trigger for migraines included 10 units in each masseter to see if that helps with migraine severity.   Show htn hld    9/16/202: stable  10/28/2022: stable      Consent Form Botulism Toxin Injection For Chronic Migraine    Reviewed orally with patient, additionally signature is on file:  Botulism toxin has been approved by the Federal drug administration for treatment of chronic migraine. Botulism toxin does not cure chronic migraine and it may not be effective in some patients.  The administration of botulism toxin is accomplished by injecting a small amount of toxin into the muscles of the neck and head. Dosage must be titrated for each individual. Any benefits resulting from botulism toxin tend to wear off after 3 months with a repeat injection required if benefit is to be maintained. Injections are usually done every 3-4 months with maximum effect peak achieved by about 2 or 3 weeks. Botulism toxin is expensive and you should be sure of what costs you will incur resulting from the injection.  The side effects of botulism toxin use for chronic migraine may include:   -Transient, and usually mild, facial weakness with facial injections  -Transient, and usually mild, head or neck weakness with head/neck injections  -Reduction or loss of forehead facial animation due to forehead muscle weakness  -Eyelid drooping  -Dry eye  -Pain at the site of injection or bruising at the site of injection  -Double vision  -Potential unknown long term risks   Contraindications: You should not have Botox if you are pregnant, nursing, allergic to albumin, have an infection, skin condition, or muscle weakness at the site of the injection, or have myasthenia gravis, Lambert-Eaton syndrome, or ALS.  It  is also possible that as with any injection, there may be an allergic reaction or no effect from the medication. Reduced effectiveness after repeated injections is sometimes seen and rarely infection at the injection site may occur. All care will be taken to prevent these side effects. If therapy is given over a long time, atrophy and wasting in the muscle injected may occur. Occasionally the patient's become refractory to treatment because they develop antibodies to the toxin. In this event, therapy needs to be modified.  I have read the above information and consent to the administration of botulism toxin.    BOTOX PROCEDURE NOTE FOR MIGRAINE HEADACHE  Contraindications and precautions discussed with patient(above). Aseptic procedure was observed and patient tolerated procedure. Procedure performed by Ihor Austin, AGNP-BC.   The condition has existed for more than 6 months, and pt does not have a diagnosis of ALS, Myasthenia Gravis or Lambert-Eaton Syndrome.  Risks and benefits of injections discussed and pt agrees to proceed with the procedure.  Written consent obtained  These injections are medically necessary. Pt  receives good benefits from these injections. These injections do not cause sedations or hallucinations which the oral therapies may cause.   Description of procedure:  The patient was placed in a sitting position. The standard protocol was used for Botox as follows, with 5 units of Botox injected at each site:  -Procerus muscle, midline injection  -Corrugator muscle, bilateral injection  -Frontalis muscle, bilateral injection, with 2 sites each side, medial injection was performed in the upper one third of the  frontalis muscle, in the region vertical from the medial inferior edge of the superior orbital rim. The lateral injection was again in the upper one third of the forehead vertically above the lateral limbus of the cornea, 1.5 cm lateral to the medial injection  site.  -Temporalis muscle injection, 4 sites, bilaterally. The first injection was 3 cm above the tragus of the ear, second injection site was 1.5 cm to 3 cm up from the first injection site in line with the tragus of the ear. The third injection site was 1.5-3 cm forward between the first 2 injection sites. The fourth injection site was 1.5 cm posterior to the second injection site. 5th site laterally in the temporalis  muscleat the level of the outer canthus.  -Occipitalis muscle injection, 3 sites, bilaterally. The first injection was done one half way between the occipital protuberance and the tip of the mastoid process behind the ear. The second injection site was done lateral and superior to the first, 1 fingerbreadth from the first injection. The third injection site was 1 fingerbreadth superiorly and medially from the first injection site.  -Cervical paraspinal muscle injection, 2 sites, bilaterally. The first injection site was 1 cm from the midline of the cervical spine, 3 cm inferior to the lower border of the occipital protuberance. The second injection site was 1.5 cm superiorly and laterally to the first injection site.  -Trapezius muscle injection was performed at 3 sites, bilaterally. The first injection site was in the upper trapezius muscle halfway between the inflection point of the neck, and the acromion. The second injection site was one half way between the acromion and the first injection site. The third injection was done between the first injection site and the inflection point of the neck.    A total of 200 units of Botox was prepared, 155 units of Botox was injected as documented above, any Botox not injected was wasted. The patient tolerated the procedure well, there were no complications of the above procedure.   Ihor Austin, AGNP-BC  [facility] [address] [place], [place] [address]  Phone [phone] Fax [phone] Note:  This document was prepared with digital dictation and possible smart phrase technology. Any transcriptional errors that result from this process are unintentional.

## 2023-05-08 ENCOUNTER — Ambulatory Visit: Payer: 59 | Attending: Cardiology | Admitting: Cardiology

## 2023-05-08 ENCOUNTER — Encounter: Payer: Self-pay | Admitting: Cardiology

## 2023-05-08 VITALS — BP 118/78 | HR 91 | Ht 63.5 in | Wt 151.0 lb

## 2023-05-08 DIAGNOSIS — I10 Essential (primary) hypertension: Secondary | ICD-10-CM

## 2023-05-08 DIAGNOSIS — K219 Gastro-esophageal reflux disease without esophagitis: Secondary | ICD-10-CM

## 2023-05-08 DIAGNOSIS — R931 Abnormal findings on diagnostic imaging of heart and coronary circulation: Secondary | ICD-10-CM | POA: Insufficient documentation

## 2023-05-08 NOTE — Progress Notes (Unsigned)
Cardiology Office Note:    Date:  05/08/2023   ID:  Jennifer Reed, DOB 1965/12/26, MRN [id number]  PCP:  Wilfrid Lund, PA  Cardiologist:  Gypsy Balsam, MD    Referring MD: Wilfrid Lund, [place]   Chief Complaint  Patient presents with   Follow-up    History of Present Illness:    Jennifer Reed is a 57 y.o. female past medical history significant for elevated calcium score which is 1194 which is 99 percentile, dyslipidemia, and essential hypertension, anxiety.  Comes today to months for follow-up overall doing [place] a lot very happy asymptomatic no chest pain tightness squeezing pressure burning chest.  Does have some atypical chest pain that happen at rest but otherwise is able to do what ever she wants to do with no difficulties [place] does not smoke  Past Medical History:  Diagnosis Date   Acute confusional migraine, refractory 07/21/2014   Allergic rhinitis 10/16/2001   Rhinitis Allergic  NOS Rhinitis Allergic  NOS  Formatting of this note might be different from the original. Overview:  Rhinitis Allergic  NOS   Allergies    Anxiety 05/07/2020   Chronic migraine without aura, with intractable migraine, so stated, with status migrainosus 03/07/2020   Cognitive complaints 05/14/2018   COVID-19 long hauler manifesting chronic concentration deficit 06/04/2020   Dry eye syndrome    Encounter for screening for cardiovascular disorders 03/11/2009   Essential hypertension 10/08/2006   (Problem list name updated by automated process. Provider to review and confirm.) (Problem list name updated by automated process. Provider to review and confirm.)  Formatting of this note might be different from the original. Overview:  (Problem list name updated by automated process. Provider to review and confirm.)   Excessive daytime sleepiness 06/04/2020   Fibromyalgia    Gastritis    Gastroesophageal reflux disease without esophagitis  04/13/2018   GERD (gastroesophageal reflux disease)    Headache 04/28/2013   Problem list name updated by automated process. Provider to review Problem list name updated by automated process. Provider to review  Formatting of this note might be different from the original. Overview:  Problem list name updated by automated process. Provider to review   Hemorrhoids 10/13/2018   High blood pressure    History of colon polyps 10/13/2018   IBS (irritable bowel syndrome)    Ingrown toenail 01/20/2020   Intractable migraine 04/28/2013   Formatting of this note might be different from the original. Overview:  Problem list name updated by automated process. Provider to review  Formatting of this note might be different from the original. Overview:  updating diagnosis code for icd10 cutover   Ischial bursitis of left side 07/01/2017   Added automatically from request for surgery [id number]   Low back pain 04/28/2016   Lumbar radiculopathy 04/28/2016   Migraine 10/16/2001   Problem list name updated by automated process. Provider to review Problem list name updated by automated process. Provider to review  updating diagnosis code for icd10 cutover updating diagnosis code for icd10 cutover  updating diagnosis code for icd10 cutover updating diagnosis code for icd10 cutover  Migraine Without Aura Migraine Without Aura  Formatting of this note might be different from th   Migraine without aura, not refractory 07/29/2013   Formatting of this note might be different from the original. Overview:  updating diagnosis code for icd10 cutover   Migraines    Mild major depression (  HCC) 10/16/2009   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   Morning headache 08/02/2020   Raynaud's disease    Raynaud's phenomenon 10/08/2006   (Problem list name updated by automated process. Provider to review and confirm.) (Problem list name updated by automated process. Provider to review and confirm.)   Raynaud's syndrome  without gangrene 10/08/2006   Formatting of this note might be different from the original. Overview:  (Problem list name updated by automated process. 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   atorvastatin (LIPITOR) 20 MG tablet Take 1 tablet (20 mg total) by mouth daily.   botulinum toxin Type A (BOTOX) 200 units injection Provider to inject 155 units into the muscles of the head and neck every 12 weeks. Discard remainder. (Patient taking differently: Inject 200 Units into the muscle every 3 (three) months. Provider to inject 155 units into the muscles of the head and neck every 12 weeks. Discard remainder.)   buPROPion (WELLBUTRIN XL) 150 MG 24 hr tablet Take 150 mg by mouth daily.   escitalopram (LEXAPRO) 10 MG tablet Take 10 mg by mouth in the morning and at bedtime.   estradiol (ESTRACE) 1 MG tablet Take 0.5 mg by mouth daily.   losartan (COZAAR) 25 MG tablet Take 1 tablet (25 mg total) by mouth daily.   pregabalin (LYRICA) 150 MG capsule Take 150 mg by mouth 3 (three) times  daily.   SAVELLA 50 MG TABS tablet Take 1 tablet (50 mg total) by mouth 2 (two) times daily.   topiramate (TOPAMAX) 100 MG tablet Take 1 tablet (100 mg total) by mouth 2 (two) times daily.   Ubrogepant (UBRELVY) 100 MG TABS TAKE 1 TAB EVERY 2 HOURS AS NEEDED MIGRAINE AT ONSET OF MIGRAINE AS POSSIBLE. MAX 2 TABS PER DAILY. (Patient taking differently: Take 1 tablet by mouth every 2 (two) hours as needed (Migraines).)   Zavegepant HCl (ZAVZPRET) 10 MG/ACT SOLN Place 1 spray into the nose as needed (migraine). Administer as soon as possible at migraine onset. Max of 1 spray per day.     Allergies:   Codeine, Gluten meal, Milk (cow), Other, Amlodipine besylate, Milk-related compounds, and Wheat   Social History   Socioeconomic History   Marital status: Married    Spouse name: Not on file   Number of children: 2   Years of education: Not on file   Highest education level: Not on file  Occupational History   Not on file  Tobacco Use   Smoking status: Never    Passive exposure: Never  Smokeless tobacco: Never  Vaping Use   Vaping status: Never Used  Substance and Sexual Activity   Alcohol use: Yes    Comment: social drinks rarely ("like a cocktail a month")   Drug use: Never   Sexual activity: Not on file  Other Topics Concern   Not on file  Social History Narrative   Lives at home with husband and dog   Right handed   Caffeine: n/a    Social Drivers of Health   Financial Resource Strain: Low Risk  (06/09/2022)   Received from [facility], [facility]   Overall Financial Resource Strain (CARDIA)    Difficulty of Paying Living Expenses: Not hard at all  Food Insecurity: No Food Insecurity (06/09/2022)   Received from [facility], [facility]   Hunger Vital Sign    Worried About Running Out of Food in the Last Year: Never true    Ran Out of Food in the Last Year: Never true  Transportation Needs: No Transportation Needs (06/09/2022)   Received from [facility], [facility]   PRAPARE - Transportation    Lack of Transportation (Medical): No    Lack of Transportation (Non-Medical): No  Physical Activity: Insufficiently Active (06/09/2022)   Received from [facility], [facility]   Exercise Vital Sign    Days of Exercise per Week: 3 days    Minutes of Exercise per Session: 10 min  Stress: No Stress Concern Present (06/09/2022)   Received from [facility], [facility] - Occupational Stress Questionnaire    Feeling of Stress : Not at all  Social Connections: Moderately Integrated (06/09/2022)   Received from [facility], [facility]   Social Network    How would you rate your social network (family, work, friends)?: Adequate participation with social networks     Family History: The patient's family history includes Heart attack in her mother. ROS:   Please see the history of present illness.    All 14 point review of systems negative except as described per history of present illness  EKGs/Labs/Other Studies Reviewed:         Recent Labs: 02/24/2023: ALT 12; BUN 12; Creatinine, Ser 0.91; Potassium 4.3; Sodium 142  Recent Lipid Panel    Component Value Date/Time   CHOL 162 02/24/2023 1218   TRIG 110 02/24/2023 1218   HDL 64 02/24/2023 1218   CHOLHDL 2.5 02/24/2023 1218   LDLCALC 78 02/24/2023 1218    Physical Exam:    VS:  BP 118/78 (BP Location: Right Arm, Patient Position: Sitting)   Pulse 91   Ht 5' 3.5" (1.613 m)   Wt 151 lb (68.5 kg)   SpO2 99%   BMI 26.33 kg/m     Wt Readings from Last 3 Encounters:  05/08/23 151 lb (68.5 kg)  01/09/23 146 lb 12.8 oz (66.6 kg)  11/10/22 145 lb (65.8 kg)     GEN:  Well nourished, well developed in no acute distress HEENT: Normal NECK: No JVD; No carotid bruits LYMPHATICS: No lymphadenopathy CARDIAC: RRR, no murmurs, no rubs, no gallops RESPIRATORY:  Clear to auscultation without rales, wheezing or rhonchi  ABDOMEN: Soft,  non-tender, non-distended MUSCULOSKELETAL:  No edema; No deformity  SKIN: Warm and dry LOWER EXTREMITIES: no swelling NEUROLOGIC:  Alert and oriented x 3 PSYCHIATRIC:  Normal affect   ASSESSMENT:    1. Essential hypertension   2. Elevated coronary artery calcium score 1194 which is 99 percentile  3. Gastroesophageal reflux disease, unspecified whether esophagitis present    PLAN:    In order of problems listed above:  Elevated calcium score but no signs and symptoms of angina.  I encouraged him to be more active.  I asked her also to start taking 1 baby aspirin every single day.  Asked her to let me know if she develop any chest pain. Essential hypertension: Blood pressure well-controlled continue present management. Dyslipidemia she is taking statin last fasting lipid profile in October of this year show LDL 78 HDL 64 we will continue present management. Gastroesophageal reflux disease I recommended to see gastroenterologist   Medication Adjustments/Labs and Tests Ordered: Current medicines are reviewed at length with the patient today.  Concerns regarding medicines are outlined above.  No orders of the defined types were placed in this encounter.  Medication changes: No orders of the defined types were placed in this encounter.   Signed, Georgeanna Lea, MD, [facility] 05/08/2023 11:14 AM    [facility]

## 2023-05-08 NOTE — Patient Instructions (Addendum)
Medication Instructions:  Your physician recommends that you continue on your current medications as directed. Please refer to the Current Medication list given to you today.  *If you need a refill on your cardiac medications before your next appointment, please call your pharmacy*   Lab Work: March or April 3rd Floor   [address]  Your physician recommends that you return for lab work in:    You need to have labs done when you are fasting.  You can come Monday through Friday 8:00 am to 11:30AM and 1:00 to 4:00. You do not need to make an appointment as the order has already been placed.     Testing/Procedures: None Ordered   Follow-Up: At [facility], you and your health needs are our priority.  As part of our continuing mission to provide you with exceptional heart care, we have created designated Provider Care Teams.  These Care Teams include your primary Cardiologist (physician) and Advanced Practice Providers (APPs -  Physician Assistants and Nurse Practitioners) who all work together to provide you with the care you need, when you need it.  We recommend signing up for the patient portal called "MyChart".  Sign up information is provided on this After Visit Summary.  MyChart is used to connect with patients for Virtual Visits (Telemedicine).  Patients are able to view lab/test results, encounter notes, upcoming appointments, etc.  Non-urgent messages can be sent to your provider as well.   To learn more about what you can do with MyChart, go to [email or web address].    Your next appointment:   12 month(s)  The format for your next appointment:   In Person  Provider:   Gypsy Balsam, MD    Other Instructions NA

## 2023-05-25 ENCOUNTER — Telehealth: Payer: Self-pay | Admitting: Pharmacy Technician

## 2023-05-25 NOTE — Telephone Encounter (Signed)
 Pharmacy Patient Advocate Encounter   Received notification from CoverMyMeds that prior authorization for Zavzpret  10MG /ACT solution is required/requested.   Insurance verification completed.   The patient is insured through [facility] .   Per test claim: PA required; PA submitted to above mentioned insurance via CoverMyMeds Key/confirmation #/EOC [facility] Status is pending

## 2023-06-03 NOTE — Telephone Encounter (Signed)
Pharmacy Patient Advocate Encounter  Received notification from [facility] that Prior Authorization for Zavzpret 10MG /ACT solution has been APPROVED from 05/25/23 to 05/24/24   PA #/Case ID/Reference #: PA Case ID #: [id number]

## 2023-06-16 ENCOUNTER — Other Ambulatory Visit: Payer: Self-pay | Admitting: *Deleted

## 2023-06-16 ENCOUNTER — Other Ambulatory Visit: Payer: Self-pay | Admitting: Cardiology

## 2023-06-16 ENCOUNTER — Other Ambulatory Visit: Payer: Self-pay | Admitting: Neurology

## 2023-06-17 ENCOUNTER — Other Ambulatory Visit: Payer: Self-pay

## 2023-06-23 ENCOUNTER — Other Ambulatory Visit: Payer: Self-pay | Admitting: Neurology

## 2023-06-23 ENCOUNTER — Telehealth: Payer: Self-pay | Admitting: Neurology

## 2023-06-23 NOTE — Telephone Encounter (Signed)
Submitted Botox auth via CMM, status is pending. Key: [id number]

## 2023-06-23 NOTE — Telephone Encounter (Signed)
Auth#: [id number] (06/23/23-09/20/23), pt will continue to fill through [facility].

## 2023-06-28 ENCOUNTER — Encounter: Payer: Self-pay | Admitting: Neurology

## 2023-07-06 ENCOUNTER — Telehealth: Payer: Self-pay | Admitting: Neurology

## 2023-07-06 NOTE — Telephone Encounter (Signed)
 Festus Barren is calling from [employer], wanting to schedule delivery of pt's Botox but she needs to confirm with clinical staff pt has not had any medical changes.  I spoke to Lamont, relayed that pt is due 07-17-2023 for her next botox.  She has zavzpret prn for acute migraine.  No other issues.  She will send  UPS/FEDX  tomorrow, (prior to 5P). This may be overnighted to be here by 5p.  (1 box every 84 days).

## 2023-07-17 ENCOUNTER — Ambulatory Visit: Payer: 59 | Admitting: Neurology

## 2023-07-17 VITALS — BP 142/89 | HR 102

## 2023-07-17 DIAGNOSIS — G43709 Chronic migraine without aura, not intractable, without status migrainosus: Secondary | ICD-10-CM

## 2023-07-17 MED ORDER — ONABOTULINUMTOXINA 200 UNITS IJ SOLR
155.0000 [IU] | Freq: Once | INTRAMUSCULAR | Status: AC
Start: 2023-07-17 — End: 2023-07-17
  Administered 2023-07-17: 155 [IU] via INTRAMUSCULAR

## 2023-07-17 NOTE — Progress Notes (Signed)
 Botox- 200 units x 1 vial Lot:[id number] Expiration: 08/2025 NDC: 7829-5621-30   Bacteriostatic 0.9% Sodium Chloride- 4mL total Lot: [id number] Expiration: 03/12/24 NDC: 6962-9528-41   Dx: L24.401 SP   Witnessed by: Shanda Bumps NP

## 2023-07-17 NOTE — Progress Notes (Signed)
 07/17/2023:  stable. > 50% improvement in migraine freq and severity.  Patient feels that her migraines cause her jaw to ache in her jaw aching also can cause her migraines to worsen it is a trigger for migraines included 10 units in each masseter to see if that helps with migraine severity.   04/23/2023: Doing well > 50% improvement in migraine freq and severity. Patient feels that her migraines cause her jaw to ache in her jaw aching also can cause her migraines to worsen it is a trigger for migraines included 10 units in each masseter to see if that helps with migraine severity.   Show htn hld    9/16/202: stable  10/28/2022: stable      Consent Form Botulism Toxin Injection For Chronic Migraine    Reviewed orally with patient, additionally signature is on file:  Botulism toxin has been approved by the Federal drug administration for treatment of chronic migraine. Botulism toxin does not cure chronic migraine and it may not be effective in some patients.  The administration of botulism toxin is accomplished by injecting a small amount of toxin into the muscles of the neck and head. Dosage must be titrated for each individual. Any benefits resulting from botulism toxin tend to wear off after 3 months with a repeat injection required if benefit is to be maintained. Injections are usually done every 3-4 months with maximum effect peak achieved by about 2 or 3 weeks. Botulism toxin is expensive and you should be sure of what costs you will incur resulting from the injection.  The side effects of botulism toxin use for chronic migraine may include:   -Transient, and usually mild, facial weakness with facial injections  -Transient, and usually mild, head or neck weakness with head/neck injections  -Reduction or loss of forehead facial animation due to forehead muscle weakness  -Eyelid drooping  -Dry eye  -Pain at the site of injection or bruising at the site of  injection  -Double vision  -Potential unknown long term risks   Contraindications: You should not have Botox if you are pregnant, nursing, allergic to albumin, have an infection, skin condition, or muscle weakness at the site of the injection, or have myasthenia gravis, Lambert-Eaton syndrome, or ALS.  It is also possible that as with any injection, there may be an allergic reaction or no effect from the medication. Reduced effectiveness after repeated injections is sometimes seen and rarely infection at the injection site may occur. All care will be taken to prevent these side effects. If therapy is given over a long time, atrophy and wasting in the muscle injected may occur. Occasionally the patient's become refractory to treatment because they develop antibodies to the toxin. In this event, therapy needs to be modified.  I have read the above information and consent to the administration of botulism toxin.    BOTOX PROCEDURE NOTE FOR MIGRAINE HEADACHE  Contraindications and precautions discussed with patient(above). Aseptic procedure was observed and patient tolerated procedure. Procedure performed by Ihor Austin, AGNP-BC.   The condition has existed for more than 6 months, and pt does not have a diagnosis of ALS, Myasthenia Gravis or Lambert-Eaton Syndrome.  Risks and benefits of injections discussed and pt agrees to proceed with the procedure.  Written consent obtained  These injections are medically necessary. Pt  receives good benefits from these injections. These injections do not cause sedations or hallucinations which the oral therapies may cause.   Description of procedure:  The patient  was placed in a sitting position. The standard protocol was used for Botox as follows, with 5 units of Botox injected at each site:  -Procerus muscle, midline injection  -Corrugator muscle, bilateral injection  -Frontalis muscle, bilateral injection, with 2 sites each side, medial injection  was performed in the upper one third of the frontalis muscle, in the region vertical from the medial inferior edge of the superior orbital rim. The lateral injection was again in the upper one third of the forehead vertically above the lateral limbus of the cornea, 1.5 cm lateral to the medial injection site.  -Temporalis muscle injection, 4 sites, bilaterally. The first injection was 3 cm above the tragus of the ear, second injection site was 1.5 cm to 3 cm up from the first injection site in line with the tragus of the ear. The third injection site was 1.5-3 cm forward between the first 2 injection sites. The fourth injection site was 1.5 cm posterior to the second injection site. 5th site laterally in the temporalis  muscleat the level of the outer canthus.  -Occipitalis muscle injection, 3 sites, bilaterally. The first injection was done one half way between the occipital protuberance and the tip of the mastoid process behind the ear. The second injection site was done lateral and superior to the first, 1 fingerbreadth from the first injection. The third injection site was 1 fingerbreadth superiorly and medially from the first injection site.  -Cervical paraspinal muscle injection, 2 sites, bilaterally. The first injection site was 1 cm from the midline of the cervical spine, 3 cm inferior to the lower border of the occipital protuberance. The second injection site was 1.5 cm superiorly and laterally to the first injection site.  -Trapezius muscle injection was performed at 3 sites, bilaterally. The first injection site was in the upper trapezius muscle halfway between the inflection point of the neck, and the acromion. The second injection site was one half way between the acromion and the first injection site. The third injection was done between the first injection site and the inflection point of the neck.    A total of 200 units of Botox was prepared, 155 units of Botox was injected as  documented above, any Botox not injected was wasted. The patient tolerated the procedure well, there were no complications of the above procedure.   Ihor Austin, AGNP-BC  [facility] [address] [place], [place] [address]  Phone [phone] Fax [phone] Note: This document was prepared with digital dictation and possible smart phrase technology. Any transcriptional errors that result from this process are unintentional.

## 2023-08-11 ENCOUNTER — Encounter: Payer: Self-pay | Admitting: Neurology

## 2023-08-25 ENCOUNTER — Encounter: Payer: Self-pay | Admitting: Neurology

## 2023-08-26 ENCOUNTER — Other Ambulatory Visit: Payer: Self-pay

## 2023-09-08 ENCOUNTER — Telehealth: Payer: Self-pay | Admitting: Neurology

## 2023-09-08 NOTE — Telephone Encounter (Signed)
 Submitted auth renewal request via CMM, status is pending. Key: [id number]

## 2023-09-09 NOTE — Telephone Encounter (Signed)
 Received approval, pt will continue to fill through [facility]. Auth#: [id number] (09/08/23-12/08/23)

## 2023-10-09 ENCOUNTER — Ambulatory Visit: Admitting: Neurology

## 2023-10-09 VITALS — BP 117/78 | HR 99 | Ht 63.0 in | Wt 149.6 lb

## 2023-10-09 DIAGNOSIS — G43709 Chronic migraine without aura, not intractable, without status migrainosus: Secondary | ICD-10-CM

## 2023-10-09 MED ORDER — ELETRIPTAN HYDROBROMIDE 40 MG PO TABS: 40.0000 mg | ORAL_TABLET | ORAL | 11 refills | Status: AC | PRN

## 2023-10-09 MED ORDER — ONDANSETRON 8 MG PO TBDP: 8.0000 mg | ORAL_TABLET | Freq: Three times a day (TID) | ORAL | 11 refills | Status: AC | PRN

## 2023-10-09 MED ORDER — METHYLPREDNISOLONE 4 MG PO TBPK: ORAL_TABLET | ORAL | 1 refills | Status: AC

## 2023-10-09 MED ORDER — KETOROLAC TROMETHAMINE 60 MG/2ML IM SOLN
60.0000 mg | Freq: Once | INTRAMUSCULAR | Status: AC
Start: 2023-10-09 — End: 2023-10-09
  Administered 2023-10-09: 60 mg via INTRAMUSCULAR

## 2023-10-09 MED ORDER — "BD SAFETYGLIDE SYRINGE/NEEDLE 25G X 1"" 3 ML MISC": 5 refills | Status: AC

## 2023-10-09 MED ORDER — KETOROLAC TROMETHAMINE 60 MG/2ML IM SOLN: INTRAMUSCULAR | 4 refills | Status: AC

## 2023-10-09 MED ORDER — ONABOTULINUMTOXINA 200 UNITS IJ SOLR
155.0000 [IU] | Freq: Once | INTRAMUSCULAR | Status: AC
Start: 2023-10-09 — End: 2023-10-09
  Administered 2023-10-09: 155 [IU] via INTRAMUSCULAR

## 2023-10-09 NOTE — Progress Notes (Signed)
 Per Dr Tresia Fruit please give  patient Toradol  60mg  injection Gave injection placed bandade on injection site Pt waited to make sure no reaction to medication . Pt went to check out

## 2023-10-09 NOTE — Progress Notes (Signed)
 Botox -200U x 1vial Lot: [id number] Expiration: 02/2026 NDC: 4540-9811-91  Bacteriostatic 0.9% Sodium Chloride- 4mL total [id number] Expiration: 11/08/2024 NDC: 3086-5784-69  Specialty pharmacy  Witnessed by: Billie Budge

## 2023-10-10 NOTE — Progress Notes (Signed)
 10/09/2023: stable. Having migraine, discussed acute management  Meds ordered this encounter  Medications   botulinum toxin Type A  (BOTOX ) injection 155 Units   ketorolac  (TORADOL ) injection 60 mg   methylPREDNISolone  (MEDROL  DOSEPAK) 4 MG TBPK tablet    Sig: Take pills daily all together with food. Take the first dose (6 pills) as soon as possible. Take the rest each morning. For 6 days total 6-5-4-3-2-1.    Dispense:  21 tablet    Refill:  1   ondansetron  (ZOFRAN -ODT) 8 MG disintegrating tablet    Sig: Take 1 tablet (8 mg total) by mouth every 8 (eight) hours as needed.    Dispense:  20 tablet    Refill:  11   methylPREDNISolone  (MEDROL  DOSEPAK) 4 MG TBPK tablet    Sig: Take pills daily all together with food. Take the first dose (6 pills) as soon as possible. Take the rest each morning. For 6 days total 6-5-4-3-2-1.    Dispense:  21 tablet    Refill:  1    Hold until asked to refill   ketorolac  (TORADOL ) 60 MG/2ML SOLN injection    Sig: Inject 2ml (60mg ) intramuscularly at onset of migraine. May repeat in 6 hours. Max twice a day and 4 days per month.    Dispense:  10 mL    Refill:  4   SYRINGE-NEEDLE, DISP, 3 ML (BD SAFETYGLIDE SYRINGE/NEEDLE) 25G X 1" 3 ML MISC    Sig: Attach needle to syringe and use to draw up and administer Toradol . Do not reuse.    Dispense:  8 each    Refill:  5   eletriptan  (RELPAX ) 40 MG tablet    Sig: Take 1 tablet (40 mg total) by mouth as needed for migraine or headache. May repeat in 2 hours if headache persists or recurs.    Dispense:  9 tablet    Refill:  11    07/17/2023:  stable. > 50% improvement in migraine freq and severity.  Patient feels that her migraines cause her jaw to ache in her jaw aching also can cause her migraines to worsen it is a trigger for migraines included 10 units in each masseter to see if that helps with migraine severity.   04/23/2023: Doing well > 50% improvement in migraine freq and severity. Patient feels that  her migraines cause her jaw to ache in her jaw aching also can cause her migraines to worsen it is a trigger for migraines included 10 units in each masseter to see if that helps with migraine severity.   Show htn hld    9/16/202: stable  10/28/2022: stable      Consent Form Botulism Toxin Injection For Chronic Migraine    Reviewed orally with patient, additionally signature is on file:  Botulism toxin has been approved by the Federal drug administration for treatment of chronic migraine. Botulism toxin does not cure chronic migraine and it may not be effective in some patients.  The administration of botulism toxin is accomplished by injecting a small amount of toxin into the muscles of the neck and head. Dosage must be titrated for each individual. Any benefits resulting from botulism toxin tend to wear off after 3 months with a repeat injection required if benefit is to be maintained. Injections are usually done every 3-4 months with maximum effect peak achieved by about 2 or 3 weeks. Botulism toxin is expensive and you should be sure of what costs you will incur resulting from the injection.  The side effects of botulism toxin use for chronic migraine may include:   -Transient, and usually mild, facial weakness with facial injections  -Transient, and usually mild, head or neck weakness with head/neck injections  -Reduction or loss of forehead facial animation due to forehead muscle weakness  -Eyelid drooping  -Dry eye  -Pain at the site of injection or bruising at the site of injection  -Double vision  -Potential unknown long term risks   Contraindications: You should not have Botox  if you are pregnant, nursing, allergic to albumin, have an infection, skin condition, or muscle weakness at the site of the injection, or have myasthenia gravis, Lambert-Eaton syndrome, or ALS.  It is also possible that as with any injection, there may be an allergic reaction or no effect from  the medication. Reduced effectiveness after repeated injections is sometimes seen and rarely infection at the injection site may occur. All care will be taken to prevent these side effects. If therapy is given over a long time, atrophy and wasting in the muscle injected may occur. Occasionally the patient's become refractory to treatment because they develop antibodies to the toxin. In this event, therapy needs to be modified.  I have read the above information and consent to the administration of botulism toxin.    BOTOX  PROCEDURE NOTE FOR MIGRAINE HEADACHE  Contraindications and precautions discussed with patient(above). Aseptic procedure was observed and patient tolerated procedure. Procedure performed by Johny Nap, AGNP-BC.   The condition has existed for more than 6 months, and pt does not have a diagnosis of ALS, Myasthenia Gravis or Lambert-Eaton Syndrome.  Risks and benefits of injections discussed and pt agrees to proceed with the procedure.  Written consent obtained  These injections are medically necessary. Pt  receives good benefits from these injections. These injections do not cause sedations or hallucinations which the oral therapies may cause.   Description of procedure:  The patient was placed in a sitting position. The standard protocol was used for Botox  as follows, with 5 units of Botox  injected at each site:  -Procerus muscle, midline injection  -Corrugator muscle, bilateral injection  -Frontalis muscle, bilateral injection, with 2 sites each side, medial injection was performed in the upper one third of the frontalis muscle, in the region vertical from the medial inferior edge of the superior orbital rim. The lateral injection was again in the upper one third of the forehead vertically above the lateral limbus of the cornea, 1.5 cm lateral to the medial injection site.  -Temporalis muscle injection, 4 sites, bilaterally. The first injection was 3 cm above the tragus  of the ear, second injection site was 1.5 cm to 3 cm up from the first injection site in line with the tragus of the ear. The third injection site was 1.5-3 cm forward between the first 2 injection sites. The fourth injection site was 1.5 cm posterior to the second injection site. 5th site laterally in the temporalis  muscleat the level of the outer canthus.  -Occipitalis muscle injection, 3 sites, bilaterally. The first injection was done one half way between the occipital protuberance and the tip of the mastoid process behind the ear. The second injection site was done lateral and superior to the first, 1 fingerbreadth from the first injection. The third injection site was 1 fingerbreadth superiorly and medially from the first injection site.  -Cervical paraspinal muscle injection, 2 sites, bilaterally. The first injection site was 1 cm from the midline of the cervical spine, 3 cm inferior  to the lower border of the occipital protuberance. The second injection site was 1.5 cm superiorly and laterally to the first injection site.  -Trapezius muscle injection was performed at 3 sites, bilaterally. The first injection site was in the upper trapezius muscle halfway between the inflection point of the neck, and the acromion. The second injection site was one half way between the acromion and the first injection site. The third injection was done between the first injection site and the inflection point of the neck.    A total of 200 units of Botox  was prepared, 155 units of Botox  was injected as documented above, any Botox  not injected was wasted. The patient tolerated the procedure well, there were no complications of the above procedure.   Johny Nap, AGNP-BC  [facility] [address] [place], [place] [address]  Phone [phone] Fax [phone] Note: This document was prepared with digital dictation and possible smart phrase technology. Any transcriptional  errors that result from this process are unintentional.

## 2023-10-19 ENCOUNTER — Encounter: Payer: Self-pay | Admitting: Neurology

## 2023-12-02 ENCOUNTER — Other Ambulatory Visit (HOSPITAL_COMMUNITY): Payer: Self-pay

## 2023-12-02 ENCOUNTER — Telehealth: Payer: Self-pay | Admitting: Pharmacy Technician

## 2023-12-02 NOTE — Telephone Encounter (Signed)
 Pharmacy Patient Advocate Encounter   Received notification from CoverMyMeds that prior authorization for Ubrelvy  100MG  tablets is required/requested.   Insurance verification completed.   The patient is insured through [facility] .   Per test claim: PA required; PA submitted to above mentioned insurance via CoverMyMeds Key/confirmation #/EOC [id number] Status is pending

## 2023-12-03 NOTE — Telephone Encounter (Signed)
 Pharmacy Patient Advocate Encounter  Received notification from [employer] that Prior Authorization for Ubrelvy  100MG  tablets  has been APPROVED from 12/02/2023 to 12/01/2025   PA #/Case ID/Reference #: [id number]

## 2023-12-05 ENCOUNTER — Encounter: Payer: Self-pay | Admitting: Neurology

## 2023-12-09 MED ORDER — NURTEC 75 MG PO TBDP: ORAL_TABLET | ORAL | 5 refills | Status: AC

## 2023-12-14 NOTE — Telephone Encounter (Signed)
 I tried multiple times to submit new auth request to [employer] and it wouldn't recognize patient. I submitted to [facility] and received approval, pt will still fill through [facility].   Auth#: [id number] (12/14/23-12/13/24)

## 2023-12-27 ENCOUNTER — Other Ambulatory Visit: Payer: Self-pay | Admitting: Adult Health

## 2024-01-01 ENCOUNTER — Ambulatory Visit: Admitting: Neurology

## 2024-01-01 ENCOUNTER — Other Ambulatory Visit: Payer: Self-pay | Admitting: Family Medicine

## 2024-01-01 VITALS — BP 115/84 | HR 93

## 2024-01-01 DIAGNOSIS — G43709 Chronic migraine without aura, not intractable, without status migrainosus: Secondary | ICD-10-CM

## 2024-01-01 DIAGNOSIS — R0989 Other specified symptoms and signs involving the circulatory and respiratory systems: Secondary | ICD-10-CM

## 2024-01-01 MED ORDER — KETOROLAC TROMETHAMINE 60 MG/2ML IM SOLN
60.0000 mg | Freq: Once | INTRAMUSCULAR | Status: AC
Start: 2024-01-01 — End: 2024-01-01
  Administered 2024-01-01: 60 mg via INTRAMUSCULAR

## 2024-01-01 MED ORDER — ONABOTULINUMTOXINA 200 UNITS IJ SOLR
155.0000 [IU] | Freq: Once | INTRAMUSCULAR | Status: AC
Start: 2024-01-01 — End: 2024-01-01
  Administered 2024-01-01: 165 [IU] via INTRAMUSCULAR

## 2024-01-01 NOTE — Progress Notes (Signed)
 01/01/2024: she can't get the nurtec will give sample. Will layer emgality  over the migraines gave samples and will check and see if improved at next appointment and can prescribe if needed. 50% improvement in migraine management on botox  but still with 15 total headache days and 8 migraine days a month. Tried ajovy . Patient feels that her migraines cause her jaw to ache in her jaw aching also can cause her migraines to worsen it is a trigger for migraines included 5 units in each masseter to see if that helps with migraine severity.    Meds ordered this encounter  Medications   botulinum toxin Type A  (BOTOX ) injection 155 Units    Botox - 200 units x 1 vial Lot: [id number] Expiration: 04/2025 NDC: 9976-6078-96  Dx: H56.290 S/P  Witnessed by Rojean DEL CMA   ketorolac  (TORADOL ) injection 60 mg   Galcanezumab -gnlm (EMGALITY ) 120 MG/ML SOAJ    Sig: Inject 120 mg into the skin every 30 (thirty) days. Please run copay card: BIN [id number] PCN PDMI GRP [id number] ID [id number] EXP 05/11/2024    Dispense:  1.12 mL    Refill:  11    Please run copay card: BIN [id number] PCN PDMI GRP [id number] ID [id number] EXP 05/11/2024   10/09/2023: stable. Having migraine, discussed acute management  07/17/2023:  stable. > 50% improvement in migraine freq and severity.  Patient feels that her migraines cause her jaw to ache in her jaw aching also can cause her migraines to worsen it is a trigger for migraines included 10 units in each masseter to see if that helps with migraine severity.   04/23/2023: Doing well > 50% improvement in migraine freq and severity. Patient feels that her migraines cause her jaw to ache in her jaw aching also can cause her migraines to worsen it is a trigger for migraines included 10 units in each masseter to see if that helps with migraine severity.   Show htn hld    9/16/202: stable  10/28/2022: stable      Consent Form Botulism Toxin Injection For Chronic  Migraine    Reviewed orally with patient, additionally signature is on file:  Botulism toxin has been approved by the Federal drug administration for treatment of chronic migraine. Botulism toxin does not cure chronic migraine and it may not be effective in some patients.  The administration of botulism toxin is accomplished by injecting a small amount of toxin into the muscles of the neck and head. Dosage must be titrated for each individual. Any benefits resulting from botulism toxin tend to wear off after 3 months with a repeat injection required if benefit is to be maintained. Injections are usually done every 3-4 months with maximum effect peak achieved by about 2 or 3 weeks. Botulism toxin is expensive and you should be sure of what costs you will incur resulting from the injection.  The side effects of botulism toxin use for chronic migraine may include:   -Transient, and usually mild, facial weakness with facial injections  -Transient, and usually mild, head or neck weakness with head/neck injections  -Reduction or loss of forehead facial animation due to forehead muscle weakness  -Eyelid drooping  -Dry eye  -Pain at the site of injection or bruising at the site of injection  -Double vision  -Potential unknown long term risks   Contraindications: You should not have Botox  if you are pregnant, nursing, allergic to albumin, have an infection, skin condition, or muscle weakness at the site of the injection,  or have myasthenia gravis, Lambert-Eaton syndrome, or ALS.  It is also possible that as with any injection, there may be an allergic reaction or no effect from the medication. Reduced effectiveness after repeated injections is sometimes seen and rarely infection at the injection site may occur. All care will be taken to prevent these side effects. If therapy is given over a long time, atrophy and wasting in the muscle injected may occur. Occasionally the patient's become refractory to  treatment because they develop antibodies to the toxin. In this event, therapy needs to be modified.  I have read the above information and consent to the administration of botulism toxin.    BOTOX  PROCEDURE NOTE FOR MIGRAINE HEADACHE  Contraindications and precautions discussed with patient(above). Aseptic procedure was observed and patient tolerated procedure. Procedure performed by Harlene Bogaert, AGNP-BC.   The condition has existed for more than 6 months, and pt does not have a diagnosis of ALS, Myasthenia Gravis or Lambert-Eaton Syndrome.  Risks and benefits of injections discussed and pt agrees to proceed with the procedure.  Written consent obtained  These injections are medically necessary. Pt  receives good benefits from these injections. These injections do not cause sedations or hallucinations which the oral therapies may cause.   Description of procedure:  The patient was placed in a sitting position. The standard protocol was used for Botox  as follows, with 5 units of Botox  injected at each site:  -Procerus muscle, midline injection  -Corrugator muscle, bilateral injection  -Frontalis muscle, bilateral injection, with 2 sites each side, medial injection was performed in the upper one third of the frontalis muscle, in the region vertical from the medial inferior edge of the superior orbital rim. The lateral injection was again in the upper one third of the forehead vertically above the lateral limbus of the cornea, 1.5 cm lateral to the medial injection site.  -Temporalis muscle injection, 4 sites, bilaterally. The first injection was 3 cm above the tragus of the ear, second injection site was 1.5 cm to 3 cm up from the first injection site in line with the tragus of the ear. The third injection site was 1.5-3 cm forward between the first 2 injection sites. The fourth injection site was 1.5 cm posterior to the second injection site. 5th site laterally in the temporalis  muscleat  the level of the outer canthus.  -Occipitalis muscle injection, 3 sites, bilaterally. The first injection was done one half way between the occipital protuberance and the tip of the mastoid process behind the ear. The second injection site was done lateral and superior to the first, 1 fingerbreadth from the first injection. The third injection site was 1 fingerbreadth superiorly and medially from the first injection site.  -Cervical paraspinal muscle injection, 2 sites, bilaterally. The first injection site was 1 cm from the midline of the cervical spine, 3 cm inferior to the lower border of the occipital protuberance. The second injection site was 1.5 cm superiorly and laterally to the first injection site.  -Trapezius muscle injection was performed at 3 sites, bilaterally. The first injection site was in the upper trapezius muscle halfway between the inflection point of the neck, and the acromion. The second injection site was one half way between the acromion and the first injection site. The third injection was done between the first injection site and the inflection point of the neck.    A total of 165 units of Botox  was prepared, 165 units of Botox  was injected as documented  above, 35u Botox  not injected was wasted. The patient tolerated the procedure well, there were no complications of the above procedure.   Harlene Bogaert, AGNP-BC  [facility] [address] [place], [place] [address]  Phone [phone] Fax [phone] Note: This document was prepared with digital dictation and possible smart phrase technology. Any transcriptional errors that result from this process are unintentional.

## 2024-01-01 NOTE — Progress Notes (Signed)
 Verbal order for toradol  60mg  IM per Dr. Ines.  No allergies.  Has tolerated in past.  Under aseptic technique 60mg /3ml IM to R upper outer quadrant (gluteal) given bandaid applied.  Pt tolerated well,

## 2024-01-01 NOTE — Progress Notes (Signed)
 Botox - 200 units x 1 vial Lot: [id number] Expiration: 04/2025 NDC: 9976-6078-96  Bacteriostatic 0.9% Sodium Chloride- 4 mL  Lot: [id number] Expiration: 03/11/2025 NDC: 9590-8033-97  Dx: H56.290 S/P  Witnessed by Rojean DEL CMA

## 2024-01-01 NOTE — Patient Instructions (Signed)
 Emgality: This month take 2 injections at once this month. And then 1 injection every month after.  Nurtec samples

## 2024-01-03 ENCOUNTER — Other Ambulatory Visit: Payer: Self-pay | Admitting: Neurology

## 2024-01-03 DIAGNOSIS — G43709 Chronic migraine without aura, not intractable, without status migrainosus: Secondary | ICD-10-CM

## 2024-01-03 MED ORDER — EMGALITY 120 MG/ML ~~LOC~~ SOAJ
120.0000 mg | SUBCUTANEOUS | 11 refills | Status: DC
Start: 2024-01-03 — End: 2024-01-20

## 2024-01-04 ENCOUNTER — Telehealth: Payer: Self-pay | Admitting: *Deleted

## 2024-01-04 NOTE — Telephone Encounter (Signed)
 Sent to prior auth team to see if PA Emgality   is needed

## 2024-01-05 ENCOUNTER — Other Ambulatory Visit: Payer: Self-pay | Admitting: Neurology

## 2024-01-05 ENCOUNTER — Other Ambulatory Visit (HOSPITAL_COMMUNITY): Payer: Self-pay

## 2024-01-05 DIAGNOSIS — G43709 Chronic migraine without aura, not intractable, without status migrainosus: Secondary | ICD-10-CM

## 2024-01-05 NOTE — Telephone Encounter (Signed)
 Pharmacy Patient Advocate Encounter   Received notification from Physician's Office that prior authorization for Emgality  120mg /ml autoinjector is required/requested.   Insurance verification completed.   The patient is insured through [facility] .   Per test claim: PA required; PA submitted to above mentioned insurance via Latent Key/confirmation #/EOC [id number] Status is pending

## 2024-01-07 NOTE — Telephone Encounter (Signed)
 Pharmacy Patient Advocate Encounter  Received notification from [employer] that Prior Authorization for Emgality  has been DENIED.  Full denial letter will be uploaded to the media tab. See denial reason below.   PA #/Case ID/Reference #: [id number]

## 2024-01-07 NOTE — Telephone Encounter (Addendum)
 Can this be sent to pharmacist for appeal? I have a tried list in past OV of tried Flexeril, tizanidine, Topamax , Trokendi , gabapentin, Lyrica, amitriptyline, Celexa, Lexapro, fluoxetine, Paxil, Zoloft, Cymbalta, Savella , doxepin, Aimovig , Botox , dry needling, yoga, acupuncture, trigger point injections and Botox , Maxalt, Fioricet, propranolol, amlodipine,  nurtec, ubrelvy  and aimovig .botox . pt has also already tried Ajovy . Should have met all requirements. Thank you!

## 2024-01-08 ENCOUNTER — Other Ambulatory Visit (HOSPITAL_COMMUNITY): Payer: Self-pay

## 2024-01-08 NOTE — Telephone Encounter (Signed)
 [id number]  Resubmitted-made sure to type all of the meds out an dthat pt should meet the requirements for approval. If denied again I will forward to the pharmacist for an appeals review.

## 2024-01-13 NOTE — Telephone Encounter (Signed)
 Was denied again-will send to the pharmacist for an appeal.

## 2024-01-13 NOTE — Telephone Encounter (Signed)
 Thanks

## 2024-01-14 ENCOUNTER — Telehealth: Payer: Self-pay | Admitting: Pharmacist

## 2024-01-14 NOTE — Telephone Encounter (Signed)
 E-Appeal has been submitted for Emgality . Will advise when response is received, please be advised that most companies may take 30 days to make a decision. Appeal letter and supporting documentation have been uploaded and submitted via CMM website on 01/14/2024.  Thank you, Devere Pandy, PharmD Clinical Pharmacist  [place]  Direct Dial: [phone]

## 2024-01-18 ENCOUNTER — Other Ambulatory Visit (HOSPITAL_COMMUNITY): Payer: Self-pay

## 2024-01-18 ENCOUNTER — Telehealth: Payer: Self-pay | Admitting: *Deleted

## 2024-01-18 ENCOUNTER — Telehealth: Payer: Self-pay | Admitting: Pharmacy Technician

## 2024-01-18 NOTE — Telephone Encounter (Addendum)
 Pharmacy Patient Advocate Encounter   Received notification from Pt Calls Messages that prior authorization for Eletriptan  40mg  is required/requested.   Insurance verification completed.   The patient is insured through [employer] .   Per test claim: The current 30 day co-pay is, $10 for 9 tabs.  No PA needed at this time. This test claim was processed through [facility]- copay amounts may vary at other pharmacies due to pharmacy/plan contracts, or as the patient moves through the different stages of their insurance plan.    Called the pharmacy the pt last had this med filled at. They didn't have any notes or reasoning as to why they are only filling 4 tablets, but said she got a too soon rejection if she tried to fill it now. Can't fill until 9/17. Asked us  to let the pt know to request 9 when she gets her refill. Called the original pharmacy it was filled at in May to see if they had notes as to why they only filled 4. Nothing there either, but couldn't try to fill it since it was transferred out. Called pt & left a msg for her to call back. I don't think this needs a PA. Pt may be able to get this filled with us , if [facility] can't fill it or is having issues. Wanted to give the pt that option.

## 2024-01-18 NOTE — Telephone Encounter (Signed)
 PA request has been Received. New Encounter has been or will be created for follow up. For additional info see Pharmacy Prior Auth telephone encounter from 01/18/24.

## 2024-01-18 NOTE — Telephone Encounter (Signed)
 Pt states she is only gets 4 Eletriptan  a month. Dr Ines prescribed 9 per month. Can we try PA? Pt has 15 total headache days and 8 migraine days a month so 4 is not enough. Thank you!

## 2024-01-19 NOTE — Telephone Encounter (Signed)
 Appeal for Emgality  has been approved by the insurance company through 07/14/2024:    Thank you, Devere Pandy, PharmD Clinical Pharmacist  [place]  Direct Dial: [phone]

## 2024-02-01 ENCOUNTER — Encounter (HOSPITAL_COMMUNITY): Payer: Self-pay

## 2024-02-01 ENCOUNTER — Other Ambulatory Visit (HOSPITAL_COMMUNITY): Payer: Self-pay

## 2024-02-10 DIAGNOSIS — G43709 Chronic migraine without aura, not intractable, without status migrainosus: Secondary | ICD-10-CM

## 2024-02-16 ENCOUNTER — Other Ambulatory Visit (HOSPITAL_COMMUNITY): Payer: Self-pay

## 2024-02-17 ENCOUNTER — Telehealth: Payer: Self-pay | Admitting: Neurology

## 2024-02-17 NOTE — Telephone Encounter (Signed)
 Sent MyChart msg seeing if pt still wants to come to us , she lives in [place].

## 2024-02-19 ENCOUNTER — Other Ambulatory Visit (HOSPITAL_COMMUNITY): Payer: Self-pay

## 2024-02-22 NOTE — Telephone Encounter (Signed)
 I called patient Jennifer Reed.

## 2024-02-24 ENCOUNTER — Telehealth: Payer: Self-pay | Admitting: Pharmacist

## 2024-02-24 NOTE — Telephone Encounter (Signed)
 Pharmacy Patient Advocate Encounter   Received notification from Patient Pharmacy that prior authorization for Nurtec 75MG  dispersible tablets is required/requested.   Insurance verification completed.   The patient is insured through [employer].   Per test claim: PA required; PA submitted to above mentioned insurance via Latent Key/confirmation #/EOC [id number] Status is pending

## 2024-02-24 NOTE — Telephone Encounter (Signed)
 Pharmacy Patient Advocate Encounter  Received notification from [employer] that Prior Authorization for NURTEC 75 MG PO TBDP has been APPROVED from 02/24/2024 to 02/23/2025   PA #/Case ID/Reference #: [id number]

## 2024-03-07 NOTE — Telephone Encounter (Signed)
 Pt is asking that a referral be sent to the office of Dr Mont Platt([facility]) her office# is [phone] pt has declined being referred over to another provider within [facility]

## 2024-03-07 NOTE — Telephone Encounter (Signed)
 Pt has returned call to Megan, NP

## 2024-03-08 NOTE — Addendum Note (Signed)
 Addended by: NEYSA NENA RAMAN on: 03/08/2024 12:56 PM   Modules accepted: Orders

## 2024-03-09 ENCOUNTER — Telehealth: Payer: Self-pay | Admitting: Adult Health

## 2024-03-09 ENCOUNTER — Telehealth: Payer: Self-pay | Admitting: Pharmacist

## 2024-03-09 NOTE — Telephone Encounter (Signed)
 Patient is not on Ubrelvy  currently per our records.

## 2024-03-09 NOTE — Telephone Encounter (Signed)
 Referral for neurology fax to [facility]. Phone: [phone], Fax: [phone]

## 2024-03-09 NOTE — Telephone Encounter (Signed)
 Pharmacy Patient Advocate Encounter   Received notification from Patient Pharmacy that prior authorization for Ubrelvy  100MG  tablets is required/requested.   Insurance verification completed.   The patient is insured through [employer].   Based on past chart notes, patient preferred Eletriptan  over Ubrelvy . Please clarify if patient is still on Ubrelvy  therapy.

## 2024-03-09 NOTE — Telephone Encounter (Signed)
 I called patient.  She now lives in [place].  She plans to establish care there.  I have signed off on the referral.  She was advised that in the meantime if she needs any assistance until she gets established there she is welcome to call our office.  She voiced understanding.

## 2024-03-09 NOTE — Addendum Note (Signed)
 Addended by: SHERRYL DUWAINE SQUIBB on: 03/09/2024 11:19 AM   Modules accepted: Orders

## 2024-03-15 ENCOUNTER — Encounter: Payer: Self-pay | Admitting: Cardiology

## 2024-03-25 ENCOUNTER — Ambulatory Visit: Admitting: Neurology

## 2024-03-30 ENCOUNTER — Other Ambulatory Visit: Payer: Self-pay | Admitting: Adult Health

## 2024-03-31 ENCOUNTER — Ambulatory Visit: Admitting: Neurology

## 2024-04-22 ENCOUNTER — Encounter: Payer: Self-pay | Admitting: Cardiology

## 2024-04-26 ENCOUNTER — Telehealth: Payer: Self-pay

## 2024-04-26 ENCOUNTER — Other Ambulatory Visit (HOSPITAL_COMMUNITY): Payer: Self-pay

## 2024-04-26 NOTE — Telephone Encounter (Signed)
 Pharmacy Patient Advocate Encounter   Received notification from Fax that prior authorization for Zavzpret  is required/requested.   Insurance verification completed.   The patient is insured through [employer].   Per test claim: PA required; PA submitted to above mentioned insurance via Latent Key/confirmation #/[id number] Status is pending

## 2024-04-28 NOTE — Telephone Encounter (Signed)
 Pharmacy Patient Advocate Encounter  Received notification from [employer] that Prior Authorization for Zavzpret  has been DENIED.  Full denial letter will be uploaded to the media tab. See denial reason below.        PA #/Case ID/Reference #: [id number]

## 2024-05-08 ENCOUNTER — Other Ambulatory Visit: Payer: Self-pay | Admitting: Cardiology

## 2024-05-17 ENCOUNTER — Ambulatory Visit: Payer: Self-pay | Admitting: Cardiology
